# Patient Record
Sex: Female | Born: 1975 | Race: Black or African American | Hispanic: No | Marital: Single | Smoking: Never smoker
Health system: Southern US, Community
[De-identification: ages and names within clinical notes are randomized; demographics above are authoritative.]

## PROBLEM LIST (undated history)

## (undated) DIAGNOSIS — M3500 Sicca syndrome, unspecified: Secondary | ICD-10-CM

## (undated) DIAGNOSIS — G709 Myoneural disorder, unspecified: Secondary | ICD-10-CM

## (undated) DIAGNOSIS — M069 Rheumatoid arthritis, unspecified: Secondary | ICD-10-CM

## (undated) DIAGNOSIS — D219 Benign neoplasm of connective and other soft tissue, unspecified: Secondary | ICD-10-CM

## (undated) DIAGNOSIS — J45909 Unspecified asthma, uncomplicated: Secondary | ICD-10-CM

## (undated) DIAGNOSIS — D649 Anemia, unspecified: Secondary | ICD-10-CM

## (undated) HISTORY — PX: WISDOM TOOTH EXTRACTION: SHX21

## (undated) HISTORY — PX: TONSILLECTOMY: SUR1361

---

## 1998-04-30 ENCOUNTER — Other Ambulatory Visit: Admission: RE | Admit: 1998-04-30 | Discharge: 1998-04-30 | Payer: Self-pay | Admitting: Obstetrics

## 1999-11-13 ENCOUNTER — Encounter: Payer: Self-pay | Admitting: Obstetrics

## 1999-11-13 ENCOUNTER — Inpatient Hospital Stay (HOSPITAL_COMMUNITY): Admission: RE | Admit: 1999-11-13 | Discharge: 1999-11-13 | Payer: Self-pay | Admitting: Obstetrics

## 1999-11-13 ENCOUNTER — Encounter (INDEPENDENT_AMBULATORY_CARE_PROVIDER_SITE_OTHER): Payer: Self-pay

## 1999-11-13 ENCOUNTER — Inpatient Hospital Stay (HOSPITAL_COMMUNITY): Admission: AD | Admit: 1999-11-13 | Discharge: 1999-11-13 | Payer: Self-pay | Admitting: *Deleted

## 2000-01-07 ENCOUNTER — Emergency Department (HOSPITAL_COMMUNITY): Admission: EM | Admit: 2000-01-07 | Discharge: 2000-01-08 | Payer: Self-pay | Admitting: Emergency Medicine

## 2000-06-11 ENCOUNTER — Emergency Department (HOSPITAL_COMMUNITY): Admission: EM | Admit: 2000-06-11 | Discharge: 2000-06-11 | Payer: Self-pay | Admitting: Internal Medicine

## 2000-08-28 ENCOUNTER — Other Ambulatory Visit: Admission: RE | Admit: 2000-08-28 | Discharge: 2000-08-28 | Payer: Self-pay | Admitting: Obstetrics

## 2009-10-01 ENCOUNTER — Ambulatory Visit (HOSPITAL_COMMUNITY): Admission: RE | Admit: 2009-10-01 | Discharge: 2009-10-01 | Payer: Self-pay | Admitting: Obstetrics

## 2010-08-26 ENCOUNTER — Emergency Department (HOSPITAL_COMMUNITY)
Admission: EM | Admit: 2010-08-26 | Discharge: 2010-08-26 | Disposition: A | Discharge status: Home / Self Care | Payer: No Typology Code available for payment source | Attending: Emergency Medicine | Admitting: Emergency Medicine

## 2010-08-26 DIAGNOSIS — M542 Cervicalgia: Secondary | ICD-10-CM | POA: Insufficient documentation

## 2010-08-26 DIAGNOSIS — S0993XA Unspecified injury of face, initial encounter: Secondary | ICD-10-CM | POA: Insufficient documentation

## 2010-08-26 DIAGNOSIS — S199XXA Unspecified injury of neck, initial encounter: Secondary | ICD-10-CM | POA: Insufficient documentation

## 2010-08-26 DIAGNOSIS — Y92410 Unspecified street and highway as the place of occurrence of the external cause: Secondary | ICD-10-CM | POA: Insufficient documentation

## 2010-12-20 ENCOUNTER — Emergency Department (HOSPITAL_COMMUNITY)
Admission: EM | Admit: 2010-12-20 | Discharge: 2010-12-21 | Disposition: A | Discharge status: Home / Self Care | Payer: No Typology Code available for payment source | Attending: Emergency Medicine | Admitting: Emergency Medicine

## 2010-12-20 DIAGNOSIS — T148XXA Other injury of unspecified body region, initial encounter: Secondary | ICD-10-CM | POA: Insufficient documentation

## 2010-12-20 DIAGNOSIS — Y92410 Unspecified street and highway as the place of occurrence of the external cause: Secondary | ICD-10-CM | POA: Insufficient documentation

## 2010-12-20 DIAGNOSIS — M542 Cervicalgia: Secondary | ICD-10-CM | POA: Insufficient documentation

## 2010-12-21 ENCOUNTER — Emergency Department (HOSPITAL_COMMUNITY): Payer: No Typology Code available for payment source

## 2011-06-08 ENCOUNTER — Other Ambulatory Visit (HOSPITAL_COMMUNITY): Payer: Self-pay | Admitting: Obstetrics

## 2011-06-08 DIAGNOSIS — D219 Benign neoplasm of connective and other soft tissue, unspecified: Secondary | ICD-10-CM

## 2011-06-14 ENCOUNTER — Ambulatory Visit (HOSPITAL_COMMUNITY)
Admission: RE | Admit: 2011-06-14 | Discharge: 2011-06-14 | Disposition: A | Discharge status: Home / Self Care | Payer: BC Managed Care – PPO | Source: Ambulatory Visit | Attending: Obstetrics | Admitting: Obstetrics

## 2011-06-14 DIAGNOSIS — D259 Leiomyoma of uterus, unspecified: Secondary | ICD-10-CM | POA: Insufficient documentation

## 2011-06-14 DIAGNOSIS — D219 Benign neoplasm of connective and other soft tissue, unspecified: Secondary | ICD-10-CM

## 2012-01-15 ENCOUNTER — Encounter (HOSPITAL_COMMUNITY): Payer: Self-pay | Admitting: *Deleted

## 2012-01-15 ENCOUNTER — Emergency Department (HOSPITAL_COMMUNITY)
Admission: EM | Admit: 2012-01-15 | Discharge: 2012-01-15 | Disposition: A | Discharge status: Home / Self Care | Payer: BC Managed Care – PPO | Attending: Emergency Medicine | Admitting: Emergency Medicine

## 2012-01-15 DIAGNOSIS — J45909 Unspecified asthma, uncomplicated: Secondary | ICD-10-CM | POA: Insufficient documentation

## 2012-01-15 DIAGNOSIS — L03012 Cellulitis of left finger: Secondary | ICD-10-CM

## 2012-01-15 DIAGNOSIS — L03019 Cellulitis of unspecified finger: Secondary | ICD-10-CM | POA: Insufficient documentation

## 2012-01-15 HISTORY — DX: Unspecified asthma, uncomplicated: J45.909

## 2012-01-15 MED ORDER — CEPHALEXIN 250 MG PO CAPS: 250.0000 mg | ORAL_CAPSULE | Freq: Four times a day (QID) | ORAL | Status: AC

## 2012-01-15 NOTE — ED Provider Notes (Signed)
History     CSN: [id number]  Arrival date & time 01/15/12  8413   First MD Initiated Contact with Patient 01/15/12 [phone]      Chief Complaint  Patient presents with  . Finger Injury    (Consider location/radiation/quality/duration/timing/severity/associated sxs/prior treatment) HPI Hx from pt. Audrey Bell is a 36 y.o. female presenting with complaint of pain and swelling to her L 5th finger which started last night. Denies injury to the area. States swelling and redness initially started at distal nail and has gradually spread down the finger. Denies fever/chills, no drainage noted from the area. No treatment prior to coming here. She had her fingernails manicured on Thursday.  Past Medical History  Diagnosis Date  . Asthma     History reviewed. No pertinent past surgical history.  No family history on file.  History  Substance Use Topics  . Smoking status: Never Smoker   . Smokeless tobacco: Not on file  . Alcohol Use: No    OB History    Grav Para Term Preterm Abortions TAB SAB Ect Mult Living                  Review of Systems  Constitutional: Negative.   Musculoskeletal: Negative.   Skin: Positive for color change and wound.  Neurological: Negative for numbness.    Allergies  Review of patient's allergies indicates no known allergies.  Home Medications   Current Outpatient Rx  Name Route Sig Dispense Refill  . FERROUS SULFATE 325 (65 FE) MG PO TABS Oral Take 325 mg by mouth daily with breakfast.    . ADULT MULTIVITAMIN W/MINERALS CH Oral Take 1 tablet by mouth daily.    . CEPHALEXIN 250 MG PO CAPS Oral Take 1 capsule (250 mg total) by mouth 4 (four) times daily. 28 capsule 0    BP 129/71  Pulse 88  Temp 98.9 F (37.2 C) (Oral)  Resp 18  Ht 5\' 8"  (1.727 m)  Wt 154 lb (69.854 kg)  BMI 23.42 kg/m2  SpO2 100%  LMP 12/15/2011  Physical Exam  Nursing note and vitals reviewed. Constitutional: She appears well-developed and well-nourished. No  distress.  HENT:  Head: Normocephalic and atraumatic.  Neck: Normal range of motion.  Cardiovascular: Normal rate.   Pulmonary/Chest: Effort normal.  Musculoskeletal: Normal range of motion.  Neurological: She is alert.  Skin: Skin is warm and dry. She is not diaphoretic.       Erythema and edema to base of nail at L 5th finger. Subtle erythema extending down finger to the PIP. TTP over area of edema; no drainage noted. FROM in finger. NVI  Psychiatric: She has a normal mood and affect.    ED Course  INCISION AND DRAINAGE Performed by: Grant Fontana Authorized by: Grant Fontana Consent: Verbal consent obtained. Risks and benefits: risks, benefits and alternatives were discussed Type: abscess Body area: upper extremity Location details: left small finger Anesthesia: digital block Local anesthetic: lidocaine 1% without epinephrine Anesthetic total: 4 ml Needle gauge: 18 Complexity: simple Drainage: purulent Drainage amount: moderate Patient tolerance: Patient tolerated the procedure well with no immediate complications.   (including critical care time)  Labs Reviewed - No data to display No results found.   1. Paronychia of fifth finger of left hand       MDM  Pt presents with swelling to L 5th finger. Appears c/w paronychia. Digital block was performed and the area was drained which was productive of purulent drainage. Will start  on Keflex, advised to cont warm soaks at home. Reasons to return discussed. Pt verbalized understanding, agreeable with plan.        Grant Fontana, PA-C 01/15/12 [phone]

## 2012-01-15 NOTE — ED Provider Notes (Addendum)
Medical screening examination/treatment/procedure(s) were performed by non-physician practitioner and as supervising physician I was immediately available for consultation/collaboration.    Dione Booze, MD 01/15/12 [phone]

## 2012-01-15 NOTE — ED Notes (Signed)
Pt noted swelling to fifth digit on left hand starting last night after seasoning chicken. Pt denies known injury or history of similar. Pt has swelling and redness noted to entire fifth digit. Pt reports swelling started initially at nailbed.

## 2012-02-02 ENCOUNTER — Other Ambulatory Visit: Payer: Self-pay | Admitting: Obstetrics

## 2012-02-02 DIAGNOSIS — N6310 Unspecified lump in the right breast, unspecified quadrant: Secondary | ICD-10-CM

## 2012-02-06 ENCOUNTER — Ambulatory Visit
Admission: RE | Admit: 2012-02-06 | Discharge: 2012-02-06 | Disposition: A | Discharge status: Home / Self Care | Payer: BC Managed Care – PPO | Source: Ambulatory Visit | Attending: Obstetrics | Admitting: Obstetrics

## 2012-02-06 DIAGNOSIS — N6310 Unspecified lump in the right breast, unspecified quadrant: Secondary | ICD-10-CM

## 2012-08-22 IMAGING — US US PELVIS COMPLETE
1 series · 14 of 25 positions shown · non-contrast
Comparison: None.

CLINICAL DATA: Fibroids

TRANSABDOMINAL AND TRANSVAGINAL ULTRASOUND OF PELVIS
TECHNIQUE: Both transabdominal and transvaginal ultrasound
examinations of the pelvis were performed. Transabdominal technique
was performed for global imaging of the pelvis including uterus,
ovaries, adnexal regions, and pelvic cul-de-sac.

[Series 1: us pelvis complete · 14 of 73 slices shown]
[im 1/73]
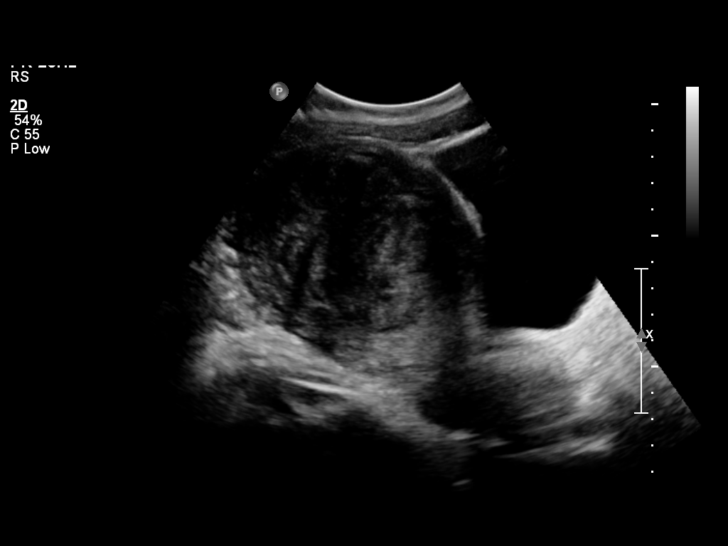
[im 7/73]
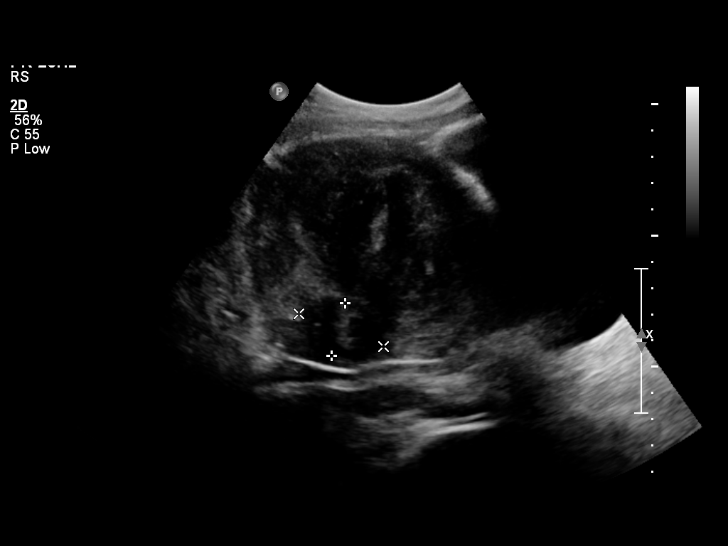
[im 13/73]
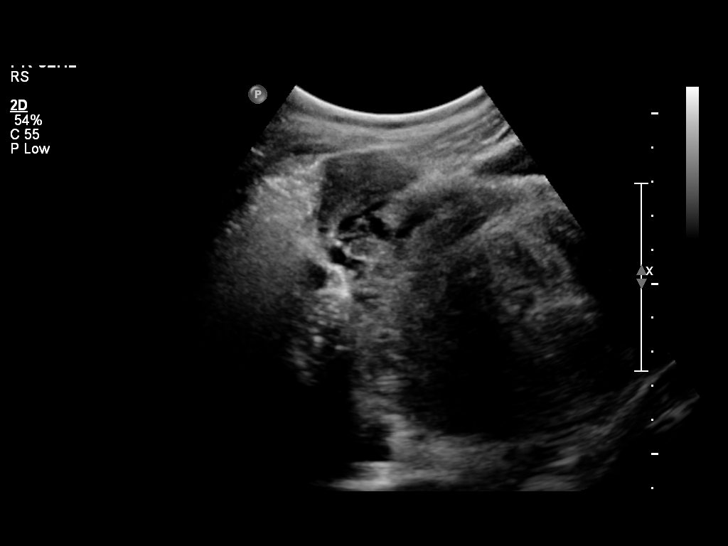
[im 19/73]
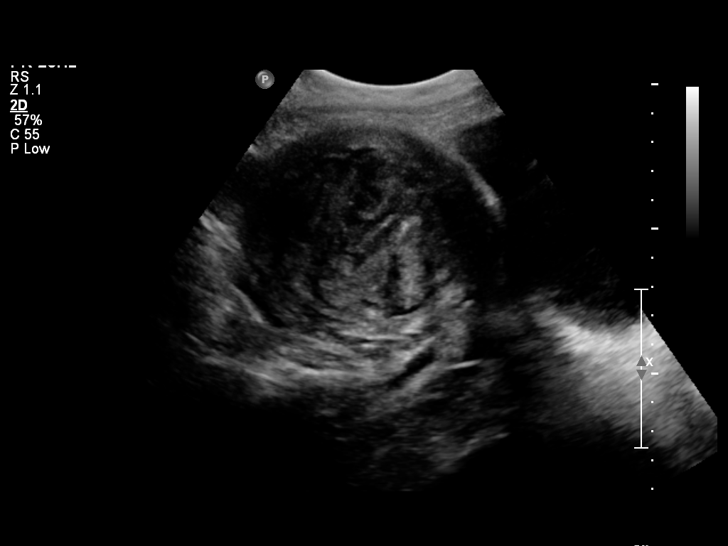
[im 25/73]
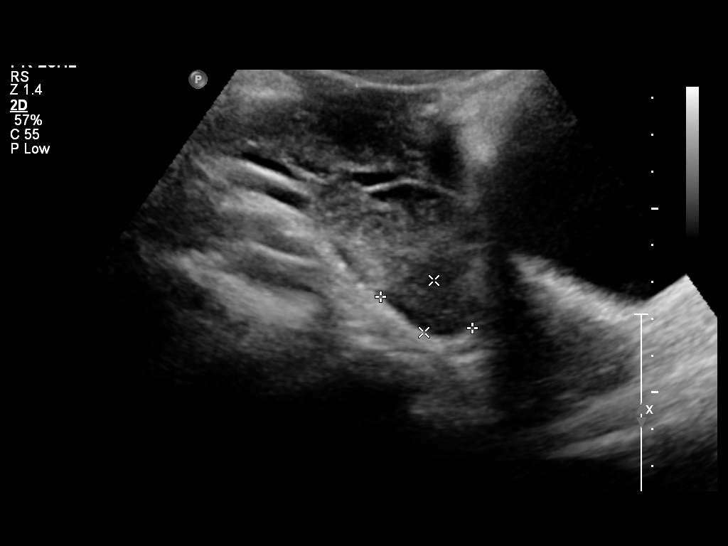
[im 28/73]
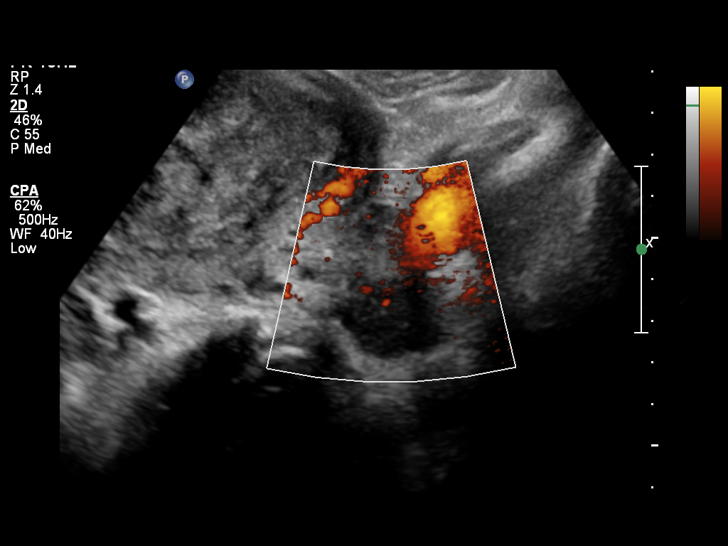
[im 34/73]
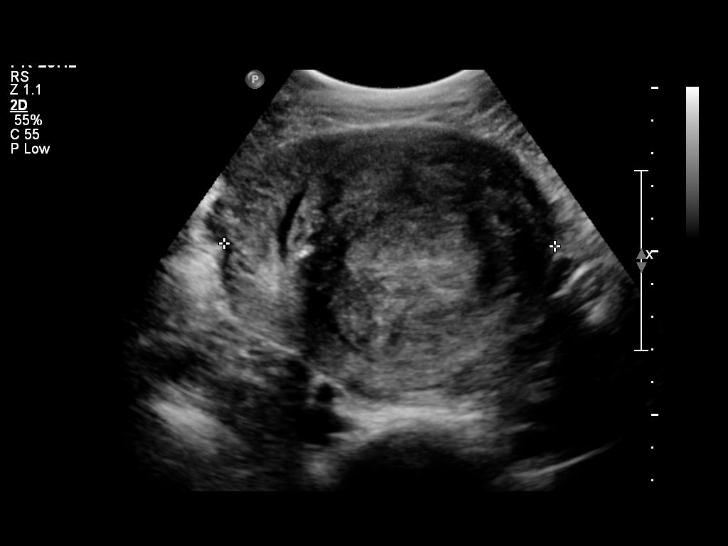
[im 40/73]
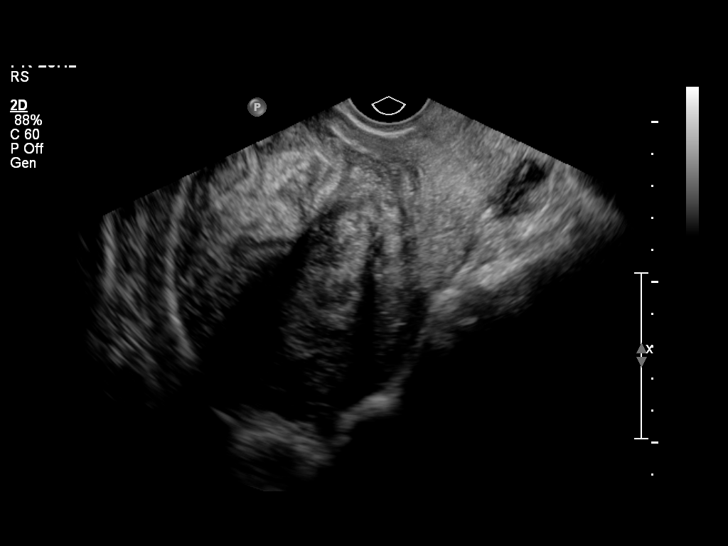
[im 46/73]
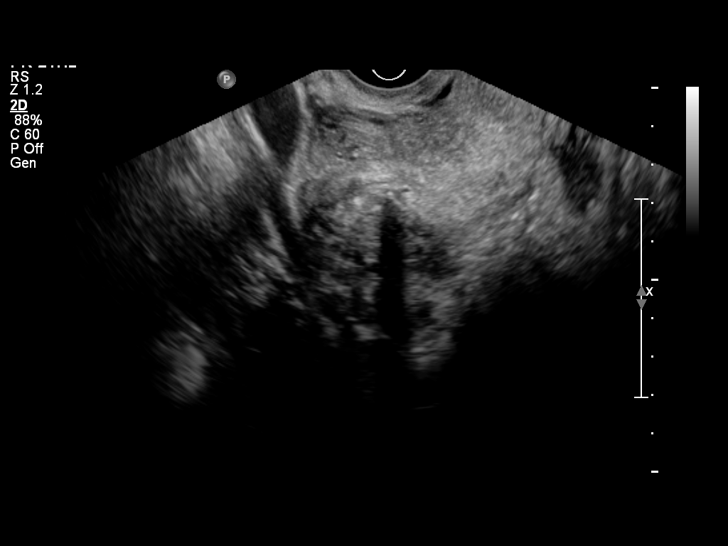
[im 49/73]
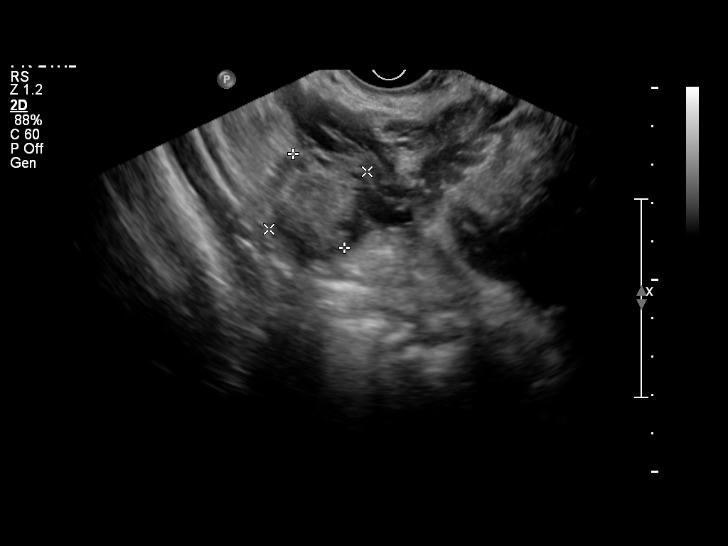
[im 55/73]
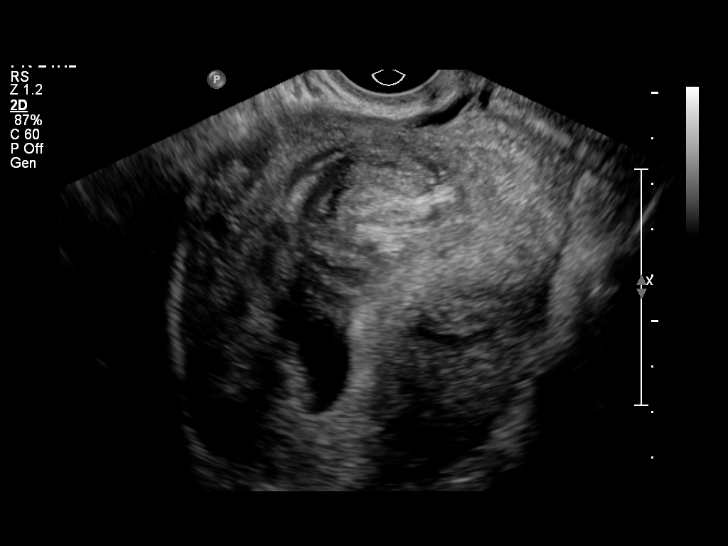
[im 61/73]
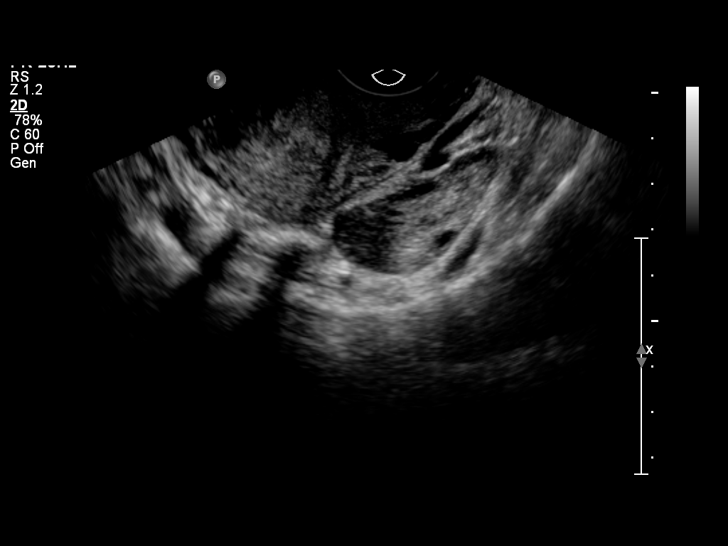
[im 67/73]
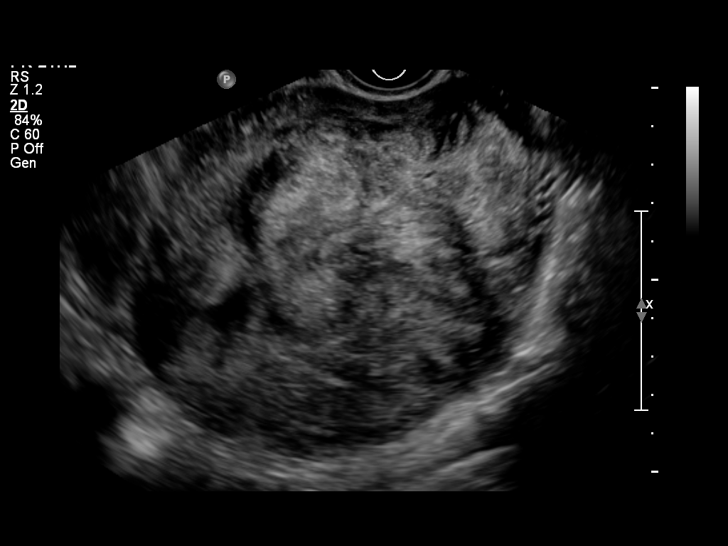
[im 73/73]
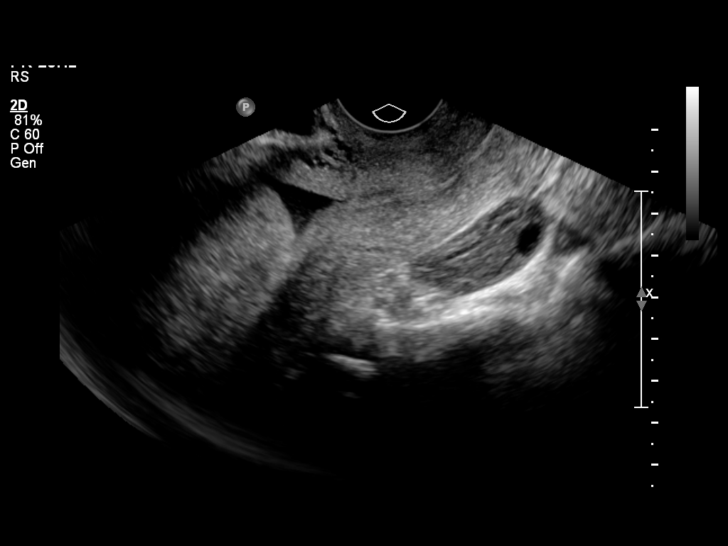

[14 of 25 positions shown; findings below may reference images not displayed]

It was necessary to proceed with endovaginal exam following the
transabdominal exam to visualize the endometrium and adnexa.
FINDINGS: The uterus is enlarged and lobular in contour, measuring 14.2 x
x 10.1 cm.  There is a 7.8 cm fundal myometrial fibroid to the left
of midline and a 3.4 cm right mid segment myometrial fibroid.  The
endometrium is displaced to the right of midline from the dominant
fibroid but is not thickened, measuring approximately 7 mm.  A
small amount of free fluid is present within the endometrial
cavity.

Both ovaries have normal size and appearance.  The right ovary
measures 2.8 x 3.0 x 2.0 cm, and the left ovary measures 4.1 x
x 3.5 cm.  There are no adnexal masses or free pelvic fluid.
IMPRESSION: Fibroid uterus, as described above..

## 2013-04-08 ENCOUNTER — Other Ambulatory Visit: Payer: Self-pay | Admitting: Obstetrics

## 2013-04-08 ENCOUNTER — Other Ambulatory Visit (HOSPITAL_COMMUNITY): Payer: Self-pay | Admitting: Obstetrics

## 2013-04-08 DIAGNOSIS — N63 Unspecified lump in unspecified breast: Secondary | ICD-10-CM

## 2013-04-08 DIAGNOSIS — D259 Leiomyoma of uterus, unspecified: Secondary | ICD-10-CM

## 2013-04-16 ENCOUNTER — Other Ambulatory Visit: Payer: Self-pay | Admitting: Obstetrics

## 2013-04-16 ENCOUNTER — Ambulatory Visit
Admission: RE | Admit: 2013-04-16 | Discharge: 2013-04-16 | Disposition: A | Discharge status: Home / Self Care | Payer: BC Managed Care – PPO | Source: Ambulatory Visit | Attending: Obstetrics | Admitting: Obstetrics

## 2013-04-16 ENCOUNTER — Ambulatory Visit (HOSPITAL_COMMUNITY)
Admission: RE | Admit: 2013-04-16 | Discharge: 2013-04-16 | Disposition: A | Discharge status: Home / Self Care | Payer: BC Managed Care – PPO | Source: Ambulatory Visit | Attending: Obstetrics | Admitting: Obstetrics

## 2013-04-16 DIAGNOSIS — D259 Leiomyoma of uterus, unspecified: Secondary | ICD-10-CM | POA: Insufficient documentation

## 2013-04-16 DIAGNOSIS — N63 Unspecified lump in unspecified breast: Secondary | ICD-10-CM

## 2013-04-16 DIAGNOSIS — N852 Hypertrophy of uterus: Secondary | ICD-10-CM | POA: Insufficient documentation

## 2013-04-19 ENCOUNTER — Encounter (HOSPITAL_COMMUNITY): Payer: Self-pay | Admitting: Emergency Medicine

## 2013-04-19 ENCOUNTER — Emergency Department (INDEPENDENT_AMBULATORY_CARE_PROVIDER_SITE_OTHER)
Admission: EM | Admit: 2013-04-19 | Discharge: 2013-04-19 | Disposition: A | Discharge status: Home / Self Care | Payer: BC Managed Care – PPO | Source: Home / Self Care | Attending: Family Medicine | Admitting: Family Medicine

## 2013-04-19 DIAGNOSIS — J069 Acute upper respiratory infection, unspecified: Secondary | ICD-10-CM

## 2013-04-19 MED ORDER — PREDNISONE 50 MG PO TABS: ORAL_TABLET | ORAL | Status: DC

## 2013-04-19 NOTE — ED Provider Notes (Signed)
CSN: [id number]     Arrival date & time 04/19/13  1819 History   First MD Initiated Contact with Patient 04/19/13 1838     Chief Complaint  Patient presents with  . URI   (Consider location/radiation/quality/duration/timing/severity/associated sxs/prior Treatment) HPI Comments: 37 year old female presents complaining of being sick for 5 days. For 5 days, she has had a cough, chest congestion, and sore throat. She also admits to some mild possible shortness of breath when lying down at night. She has a history of asthma and she has been using her inhaler much more than usual in the past few nights. Otherwise, she is not feeling short of breath and has no pleuritic chest pain. No fever, chills, NVD, recent travel, sick contacts. Cough is nonproductive. She has been taking Mucinex and combination cough and cold medications with some relief of her symptoms.  Patient is a 37 y.o. female presenting with URI.  URI Presenting symptoms: cough and sore throat   Presenting symptoms: no fever   Associated symptoms: no arthralgias and no myalgias     Past Medical History  Diagnosis Date  . Asthma    History reviewed. No pertinent past surgical history. History reviewed. No pertinent family history. History  Substance Use Topics  . Smoking status: Never Smoker   . Smokeless tobacco: Not on file  . Alcohol Use: No   OB History   Grav Para Term Preterm Abortions TAB SAB Ect Mult Living                 Review of Systems  Constitutional: Negative for fever and chills.  HENT: Positive for sore throat and voice change.   Eyes: Negative for visual disturbance.  Respiratory: Positive for cough, chest tightness and shortness of breath.   Cardiovascular: Negative for chest pain, palpitations and leg swelling.  Gastrointestinal: Negative for nausea, vomiting and abdominal pain.  Endocrine: Negative for polydipsia and polyuria.  Genitourinary: Negative for dysuria, urgency and frequency.   Musculoskeletal: Negative for arthralgias and myalgias.  Skin: Negative for rash.  Neurological: Negative for dizziness, weakness and light-headedness.    Allergies  Review of patient's allergies indicates no known allergies.  Home Medications   Current Outpatient Rx  Name  Route  Sig  Dispense  Refill  . Norgestim-Eth Estrad Triphasic (ORTHO TRI-CYCLEN, 28, PO)   Oral   Take by mouth.         . ferrous sulfate 325 (65 FE) MG tablet   Oral   Take 325 mg by mouth daily with breakfast.         . Multiple Vitamin (MULTIVITAMIN WITH MINERALS) TABS   Oral   Take 1 tablet by mouth daily.         . predniSONE (DELTASONE) 50 MG tablet      1 tab PO QD for 5 days   5 tablet   0    BP 143/95  Pulse 87  Temp(Src) 98.5 F (36.9 C) (Oral)  Resp 18  SpO2 100%  LMP 04/03/2013 Physical Exam  Nursing note and vitals reviewed. Constitutional: She is oriented to person, place, and time. Vital signs are normal. She appears well-developed and well-nourished. No distress.  HENT:  Head: Normocephalic and atraumatic.  Nose: Nose normal.  Mouth/Throat: Oropharynx is clear and moist. No oropharyngeal exudate.  Neck: Normal range of motion. Neck supple.  Cardiovascular: Normal rate, regular rhythm and normal heart sounds.  Exam reveals no gallop and no friction rub.   No murmur heard. Pulmonary/Chest:  Effort normal and breath sounds normal. No respiratory distress. She has no wheezes. She has no rales.  Lymphadenopathy:    She has no cervical adenopathy.  Neurological: She is alert and oriented to person, place, and time. She has normal strength. Coordination normal.  Skin: Skin is warm and dry. No rash noted. She is not diaphoretic.  Psychiatric: She has a normal mood and affect. Judgment normal.    ED Course  Procedures (including critical care time) Labs Review Labs Reviewed - No data to display Imaging Review No results found.    MDM   1. URI (upper respiratory  infection)    Add prednisone to prevent asthma flare. Continue albuterol and symptomatic treatment, push fluids. Followup if worsening.   New Prescriptions   PREDNISONE (DELTASONE) 50 MG TABLET    1 tab PO QD for 5 days       Graylon Good, PA-C 04/19/13 1908

## 2013-04-19 NOTE — ED Notes (Signed)
C/o  Loss of voice.  Dry nonproductive cough.  Chest congestion.  Mild relief with otc meds.  Denies fever, n/v/d

## 2013-04-20 NOTE — ED Provider Notes (Signed)
Medical screening examination/treatment/procedure(s) were performed by a resident physician or non-physician practitioner and as the supervising physician I was immediately available for consultation/collaboration.  Evan Corey, MD    Evan S Corey, MD 04/20/13 0843 

## 2013-05-02 DIAGNOSIS — M3500 Sicca syndrome, unspecified: Secondary | ICD-10-CM

## 2013-05-02 HISTORY — DX: Sjogren syndrome, unspecified: M35.00

## 2014-05-26 ENCOUNTER — Other Ambulatory Visit (HOSPITAL_COMMUNITY): Payer: Self-pay | Admitting: Obstetrics

## 2014-05-26 DIAGNOSIS — D251 Intramural leiomyoma of uterus: Secondary | ICD-10-CM

## 2014-06-02 ENCOUNTER — Ambulatory Visit (HOSPITAL_COMMUNITY)
Admission: RE | Admit: 2014-06-02 | Discharge: 2014-06-02 | Disposition: A | Discharge status: Home / Self Care | Payer: BC Managed Care – PPO | Source: Ambulatory Visit | Attending: Obstetrics | Admitting: Obstetrics

## 2014-06-02 DIAGNOSIS — D251 Intramural leiomyoma of uterus: Secondary | ICD-10-CM

## 2014-06-02 DIAGNOSIS — N939 Abnormal uterine and vaginal bleeding, unspecified: Secondary | ICD-10-CM | POA: Diagnosis not present

## 2014-06-02 DIAGNOSIS — D250 Submucous leiomyoma of uterus: Secondary | ICD-10-CM | POA: Insufficient documentation

## 2014-09-18 ENCOUNTER — Other Ambulatory Visit: Payer: Self-pay | Admitting: Obstetrics

## 2014-09-18 ENCOUNTER — Other Ambulatory Visit: Payer: Self-pay

## 2014-09-18 DIAGNOSIS — Z1231 Encounter for screening mammogram for malignant neoplasm of breast: Secondary | ICD-10-CM

## 2014-10-01 ENCOUNTER — Ambulatory Visit
Admission: RE | Admit: 2014-10-01 | Discharge: 2014-10-01 | Disposition: A | Discharge status: Home / Self Care | Payer: BC Managed Care – PPO | Source: Ambulatory Visit

## 2014-10-01 DIAGNOSIS — Z1231 Encounter for screening mammogram for malignant neoplasm of breast: Secondary | ICD-10-CM

## 2015-08-04 ENCOUNTER — Other Ambulatory Visit: Payer: Self-pay

## 2015-08-04 DIAGNOSIS — Z1231 Encounter for screening mammogram for malignant neoplasm of breast: Secondary | ICD-10-CM

## 2015-10-02 ENCOUNTER — Ambulatory Visit
Admission: RE | Admit: 2015-10-02 | Discharge: 2015-10-02 | Disposition: A | Discharge status: Home / Self Care | Payer: BC Managed Care – PPO | Source: Ambulatory Visit

## 2015-10-02 ENCOUNTER — Other Ambulatory Visit: Payer: Self-pay | Admitting: Obstetrics

## 2015-10-02 DIAGNOSIS — Z1231 Encounter for screening mammogram for malignant neoplasm of breast: Secondary | ICD-10-CM

## 2016-01-19 LAB — PROCEDURE REPORT - SCANNED: Pap: ABNORMAL — AB

## 2016-06-20 ENCOUNTER — Other Ambulatory Visit: Payer: Self-pay | Admitting: Obstetrics and Gynecology

## 2016-06-20 DIAGNOSIS — D251 Intramural leiomyoma of uterus: Secondary | ICD-10-CM

## 2016-06-23 ENCOUNTER — Ambulatory Visit
Admission: RE | Admit: 2016-06-23 | Discharge: 2016-06-23 | Disposition: A | Discharge status: Home / Self Care | Payer: BC Managed Care – PPO | Source: Ambulatory Visit | Attending: Obstetrics and Gynecology | Admitting: Obstetrics and Gynecology

## 2016-06-23 ENCOUNTER — Other Ambulatory Visit: Payer: Self-pay | Admitting: Obstetrics and Gynecology

## 2016-06-23 DIAGNOSIS — D251 Intramural leiomyoma of uterus: Secondary | ICD-10-CM

## 2016-06-23 HISTORY — DX: Benign neoplasm of connective and other soft tissue, unspecified: D21.9

## 2016-06-23 HISTORY — DX: Rheumatoid arthritis, unspecified: M06.9

## 2016-06-23 HISTORY — PX: IR GENERIC HISTORICAL: IMG1180011

## 2016-06-23 NOTE — Consult Note (Signed)
Chief Complaint: Patient was seen in consultation today for  Chief Complaint  Patient presents with  . Advice Only    Consult for [place]     at the request of Cousins,Sheronette  Referring Physician(s): Cousins,Sheronette  History of Present Illness: Audrey Bell is a 41 y.o. female gravida 2 para 2, who has a 5 year history of fibroids and menorrhagia. This has been worsening over the recent years. She is anemic and taking over-the-counter iron therapy. During her heavy bleeding days, she changes pads every 2 hours and does pass clots. Her periods last 7-10 days. She does have intermenstrual bleeding which is problematic. She has no future pregnancy plans. She denies PID or previous pelvic malignancy. She has not had any previous therapy other than oral contraceptives. She does have some bulk symptoms related to frequency, abdominal pressure, and bloating.  Past Medical History:  Diagnosis Date  . Asthma   . Fibroids   . Rheumatoid arthritis [facility])     Past Surgical History:  Procedure Laterality Date  . IR GENERIC HISTORICAL  06/23/2016   IR RADIOLOGIST EVAL & MGMT 06/23/2016 Marybelle Killings, MD [facility] INTERV RAD    Allergies: Shellfish allergy  Medications: Prior to Admission medications   Medication Sig Start Date End Date Taking? Authorizing Provider  ferrous sulfate 325 (65 FE) MG tablet Take 325 mg by mouth daily with breakfast.   Yes Historical Provider, MD  Multiple Vitamin (MULTIVITAMIN WITH MINERALS) TABS Take 1 tablet by mouth daily.   Yes Historical Provider, MD  naproxen sodium (ANAPROX) 220 MG tablet Take 220 mg by mouth 2 (two) times daily as needed.   Yes Historical Provider, MD  Norgestim-Eth Estrad Triphasic (ORTHO TRI-CYCLEN, 28, PO) Take by mouth.   Yes Historical Provider, MD  predniSONE (DELTASONE) 50 MG tablet 1 tab PO QD for 5 days 04/19/13  Yes Liam Graham, PA-C  sulfaSALAzine (AZULFIDINE) 500 MG tablet Take 500 mg by mouth daily.   Yes Historical  Provider, MD  Tofacitinib Citrate (XELJANZ) 5 MG TABS Take 1 tablet by mouth 2 (two) times daily.   Yes Historical Provider, MD     No family history on file.  Social History   Social History  . Marital status: Single    Spouse name: N/A  . Number of children: N/A  . Years of education: N/A   Social History Main Topics  . Smoking status: Never Smoker  . Smokeless tobacco: Not on file  . Alcohol use No  . Drug use: No  . Sexual activity: Yes    Birth control/ protection: Pill   Other Topics Concern  . Not on file   Social History Narrative  . No narrative on file    Review of Systems: A 12 point ROS discussed and pertinent positives are indicated in the HPI above.  All other systems are negative.  Review of Systems  Vital Signs: BP 128/90 (BP Location: Left Arm, Patient Position: Sitting, Cuff Size: Normal)   Pulse 82   Temp 98.7 F (37.1 C) (Oral)   Resp 14   Ht 5' 7.5" (1.715 m)   Wt 163 lb (73.9 kg)   LMP 05/23/2016 (Approximate)   SpO2 100%   BMI 25.15 kg/m   Physical Exam  Constitutional: She is oriented to person, place, and time. She appears well-developed and well-nourished.  Cardiovascular: Normal rate and regular rhythm.   Pulmonary/Chest: Effort normal and breath sounds normal.  Musculoskeletal: Normal range of motion.  Neurological:  She is alert and oriented to person, place, and time.    Mallampati Score: 1    Imaging: Ir Radiologist Eval & Mgmt  Result Date: 06/23/2016 Please refer to "Notes" to see consult details. Ultrasound was performed. Fibroids were noted. The largest described fibroid was 7.5 cm.  Labs:  CBC: No results for input(s): WBC, HGB, HCT, PLT in the last 8760 hours.  COAGS: No results for input(s): INR, APTT in the last 8760 hours.  BMP: No results for input(s): NA, K, CL, CO2, GLUCOSE, BUN, CALCIUM, CREATININE, GFRNONAA, GFRAA in the last 8760 hours.  Invalid input(s): CMP  LIVER FUNCTION TESTS: No results  for input(s): BILITOT, AST, ALT, ALKPHOS, PROT, ALBUMIN in the last 8760 hours.  TUMOR MARKERS: No results for input(s): AFPTM, CEA, CA199, CHROMGRNA in the last 8760 hours.  Assessment and Plan:  This Bowell does have fibroids by ultrasound. She is symptomatic consisting of menorrhagia with anemia and some intermenstrual bleeding. We discussed the procedure of uterine fibroid embolization as well as alternatives. Her questions were answered. She will require a pelvic MRI and endometrial biopsy prior to therapy.  Thank you for this interesting consult.  I greatly enjoyed meeting Audrey Bell and look forward to participating in their care.  A copy of this report was sent to the requesting provider on this date.  Electronically Signed: Boruch Manuele, ART A 06/23/2016, 10:19 AM   I spent a total of  40 Minutes   in face to face in clinical consultation, greater than 50% of which was counseling/coordinating care for uterine fibroid embolization.

## 2016-06-24 ENCOUNTER — Encounter (HOSPITAL_COMMUNITY): Payer: Self-pay | Admitting: Emergency Medicine

## 2016-06-24 ENCOUNTER — Ambulatory Visit (HOSPITAL_COMMUNITY)
Admission: EM | Admit: 2016-06-24 | Discharge: 2016-06-24 | Disposition: A | Discharge status: Home / Self Care | Payer: BC Managed Care – PPO | Attending: Family Medicine | Admitting: Family Medicine

## 2016-06-24 DIAGNOSIS — B9789 Other viral agents as the cause of diseases classified elsewhere: Secondary | ICD-10-CM | POA: Diagnosis not present

## 2016-06-24 DIAGNOSIS — J069 Acute upper respiratory infection, unspecified: Secondary | ICD-10-CM

## 2016-06-24 MED ORDER — GUAIFENESIN-CODEINE 100-10 MG/5ML PO SYRP: 10.0000 mL | ORAL_SOLUTION | Freq: Four times a day (QID) | ORAL | 0 refills | Status: DC | PRN

## 2016-06-24 MED ORDER — IPRATROPIUM BROMIDE 0.06 % NA SOLN: 2.0000 | Freq: Four times a day (QID) | NASAL | 1 refills | Status: DC

## 2016-06-24 NOTE — ED Provider Notes (Signed)
Grandfather    CSN: [id number] Arrival date & time: 06/24/16  1532     History   Chief Complaint Chief Complaint  Patient presents with  . URI    HPI Audrey Bell is a 41 y.o. female.   The history is provided by the patient.  URI  Presenting symptoms: congestion, cough and rhinorrhea   Presenting symptoms: no fever   Severity:  Mild Onset quality:  Gradual Duration:  5 days Progression:  Unchanged Chronicity:  New Risk factors: sick contacts     Past Medical History:  Diagnosis Date  . Asthma   . Fibroids   . Rheumatoid arthritis ([place])     There are no active problems to display for this patient.   Past Surgical History:  Procedure Laterality Date  . IR GENERIC HISTORICAL  06/23/2016   IR RADIOLOGIST EVAL & MGMT 06/23/2016 Marybelle Killings, MD [facility] INTERV RAD    OB History    No data available       Home Medications    Prior to Admission medications   Medication Sig Start Date End Date Taking? Authorizing Provider  Norgestim-Eth Estrad Triphasic (ORTHO TRI-CYCLEN, 28, PO) Take by mouth.   Yes Historical Provider, MD  predniSONE (DELTASONE) 50 MG tablet 1 tab PO QD for 5 days 04/19/13  Yes Liam Graham, PA-C  sulfaSALAzine (AZULFIDINE) 500 MG tablet Take 500 mg by mouth daily.   Yes Historical Provider, MD  Tofacitinib Citrate (XELJANZ) 5 MG TABS Take 1 tablet by mouth 2 (two) times daily.   Yes Historical Provider, MD  ferrous sulfate 325 (65 FE) MG tablet Take 325 mg by mouth daily with breakfast.    Historical Provider, MD  Multiple Vitamin (MULTIVITAMIN WITH MINERALS) TABS Take 1 tablet by mouth daily.    Historical Provider, MD  naproxen sodium (ANAPROX) 220 MG tablet Take 220 mg by mouth 2 (two) times daily as needed.    Historical Provider, MD    Family History No family history on file.  Social History Social History  Substance Use Topics  . Smoking status: Never Smoker  . Smokeless tobacco: Never Used  . Alcohol use No      Allergies   Shellfish allergy   Review of Systems Review of Systems  Constitutional: Negative.  Negative for fever.  HENT: Positive for congestion and rhinorrhea.   Respiratory: Positive for cough.   All other systems reviewed and are negative.    Physical Exam Triage Vital Signs ED Triage Vitals [06/24/16 1601]  Enc Vitals Group     BP 129/87     Pulse Rate 81     Resp 20     Temp 98.7 F (37.1 C)     Temp Source Oral     SpO2 100 %     Weight      Height      Head Circumference      Peak Flow      Pain Score      Pain Loc      Pain Edu?      Excl. in [place]?    No data found.   Updated Vital Signs BP 129/87 (BP Location: Right Arm)   Pulse 81   Temp 98.7 F (37.1 C) (Oral)   Resp 20   LMP 05/26/2016   SpO2 100%   Visual Acuity Right Eye Distance:   Left Eye Distance:   Bilateral Distance:    Right Eye Near:   Left  Eye Near:    Bilateral Near:     Physical Exam  Constitutional: She appears well-developed and well-nourished. No distress.  HENT:  Right Ear: External ear normal.  Left Ear: External ear normal.  Nose: Nose normal.  Mouth/Throat: Oropharynx is clear and moist.  Eyes: Conjunctivae are normal. Pupils are equal, round, and reactive to light.  Neck: Normal range of motion. Neck supple.  Cardiovascular: Normal rate and regular rhythm.   Pulmonary/Chest: Effort normal and breath sounds normal.  Lymphadenopathy:    She has no cervical adenopathy.  Skin: Skin is warm and dry.  Nursing note and vitals reviewed.    UC Treatments / Results  Labs (all labs ordered are listed, but only abnormal results are displayed) Labs Reviewed - No data to display  EKG  EKG Interpretation None       Radiology Ir Radiologist Eval & Mgmt  Result Date: 06/23/2016 Please refer to "Notes" to see consult details.   Procedures Procedures (including critical care time)  Medications Ordered in UC Medications - No data to display   Initial  Impression / Assessment and Plan / UC Course  I have reviewed the triage vital signs and the nursing notes.  Pertinent labs & imaging results that were available during my care of the patient were reviewed by me and considered in my medical decision making (see chart for details).       Final Clinical Impressions(s) / UC Diagnoses   Final diagnoses:  None    New Prescriptions New Prescriptions   No medications on file     Billy Fischer, MD 06/24/16 [phone]

## 2016-06-24 NOTE — ED Triage Notes (Signed)
Pt c/o cold sx onset: 5 days  Sx include: chest/nasal congestion, prod cough, bilateral eye drainage, laryngitis, ST  Denies: fevers  Taking: OTC cold meds w/temp relief.... Last had dayquil today at 1300  A&O x4... NAD

## 2016-06-24 NOTE — Discharge Instructions (Signed)
Drink plenty of fluids as discussed, use medicine as prescribed, and mucinex or delsym for cough. Return or see your doctor if further problems °

## 2016-07-02 ENCOUNTER — Ambulatory Visit
Admission: RE | Admit: 2016-07-02 | Discharge: 2016-07-02 | Disposition: A | Discharge status: Home / Self Care | Payer: BC Managed Care – PPO | Source: Ambulatory Visit | Attending: Obstetrics and Gynecology | Admitting: Obstetrics and Gynecology

## 2016-07-02 DIAGNOSIS — D251 Intramural leiomyoma of uterus: Secondary | ICD-10-CM

## 2016-07-02 MED ORDER — GADOBENATE DIMEGLUMINE 529 MG/ML IV SOLN
15.0000 mL | Freq: Once | INTRAVENOUS | Status: AC | PRN
  Administered 2016-07-02: 15 mL via INTRAVENOUS

## 2016-08-09 ENCOUNTER — Other Ambulatory Visit (HOSPITAL_COMMUNITY): Payer: Self-pay | Admitting: Interventional Radiology

## 2016-08-09 DIAGNOSIS — D259 Leiomyoma of uterus, unspecified: Secondary | ICD-10-CM

## 2016-08-10 ENCOUNTER — Other Ambulatory Visit: Payer: Self-pay | Admitting: Obstetrics and Gynecology

## 2016-08-11 ENCOUNTER — Encounter (HOSPITAL_COMMUNITY): Payer: Self-pay | Admitting: *Deleted

## 2016-08-15 ENCOUNTER — Ambulatory Visit (HOSPITAL_COMMUNITY): Payer: BC Managed Care – PPO | Admitting: Registered Nurse

## 2016-08-15 ENCOUNTER — Encounter (HOSPITAL_COMMUNITY): Payer: Self-pay

## 2016-08-15 ENCOUNTER — Encounter (HOSPITAL_COMMUNITY)
Admission: RE | Disposition: A | Discharge status: Home / Self Care | Payer: Self-pay | Source: Ambulatory Visit | Attending: Obstetrics and Gynecology

## 2016-08-15 ENCOUNTER — Ambulatory Visit (HOSPITAL_COMMUNITY)
Admission: RE | Admit: 2016-08-15 | Discharge: 2016-08-15 | Disposition: A | Discharge status: Home / Self Care | Payer: BC Managed Care – PPO | Source: Ambulatory Visit | Attending: Obstetrics and Gynecology | Admitting: Obstetrics and Gynecology

## 2016-08-15 DIAGNOSIS — Z793 Long term (current) use of hormonal contraceptives: Secondary | ICD-10-CM | POA: Diagnosis not present

## 2016-08-15 DIAGNOSIS — Z7952 Long term (current) use of systemic steroids: Secondary | ICD-10-CM | POA: Insufficient documentation

## 2016-08-15 DIAGNOSIS — N871 Moderate cervical dysplasia: Secondary | ICD-10-CM | POA: Diagnosis not present

## 2016-08-15 DIAGNOSIS — Z79899 Other long term (current) drug therapy: Secondary | ICD-10-CM | POA: Insufficient documentation

## 2016-08-15 DIAGNOSIS — M069 Rheumatoid arthritis, unspecified: Secondary | ICD-10-CM | POA: Insufficient documentation

## 2016-08-15 DIAGNOSIS — D250 Submucous leiomyoma of uterus: Secondary | ICD-10-CM | POA: Insufficient documentation

## 2016-08-15 DIAGNOSIS — D259 Leiomyoma of uterus, unspecified: Secondary | ICD-10-CM | POA: Diagnosis present

## 2016-08-15 DIAGNOSIS — N920 Excessive and frequent menstruation with regular cycle: Secondary | ICD-10-CM | POA: Diagnosis not present

## 2016-08-15 HISTORY — DX: Sicca syndrome, unspecified: M35.00

## 2016-08-15 HISTORY — PX: DILATATION & CURETTAGE/HYSTEROSCOPY WITH MYOSURE: SHX6511

## 2016-08-15 HISTORY — DX: Anemia, unspecified: D64.9

## 2016-08-15 HISTORY — DX: Myoneural disorder, unspecified: G70.9

## 2016-08-15 LAB — CBC
HEMATOCRIT: 36.8 % (ref 36.0–46.0)
Hemoglobin: 12.2 g/dL (ref 12.0–15.0)
MCH: 30.8 pg (ref 26.0–34.0)
MCHC: 33.2 g/dL (ref 30.0–36.0)
MCV: 92.9 fL (ref 78.0–100.0)
PLATELETS: 355 10*3/uL (ref 150–400)
RBC: 3.96 MIL/uL (ref 3.87–5.11)
RDW: 14.5 % (ref 11.5–15.5)
WBC: 5.9 10*3/uL (ref 4.0–10.5)

## 2016-08-15 SURGERY — DILATATION & CURETTAGE/HYSTEROSCOPY WITH MYOSURE
Anesthesia: General | Site: Vagina

## 2016-08-15 MED ORDER — FENTANYL CITRATE (PF) 100 MCG/2ML IJ SOLN
INTRAMUSCULAR | Status: DC | PRN
  Administered 2016-08-15: 100 ug via INTRAVENOUS

## 2016-08-15 MED ORDER — ONDANSETRON HCL 4 MG/2ML IJ SOLN
INTRAMUSCULAR | Status: DC | PRN
  Administered 2016-08-15: 4 mg via INTRAVENOUS

## 2016-08-15 MED ORDER — PROPOFOL 10 MG/ML IV BOLUS
INTRAVENOUS | Status: AC
  Filled 2016-08-15: qty 20

## 2016-08-15 MED ORDER — FENTANYL CITRATE (PF) 100 MCG/2ML IJ SOLN
INTRAMUSCULAR | Status: AC
  Filled 2016-08-15: qty 2

## 2016-08-15 MED ORDER — HYDROMORPHONE HCL 1 MG/ML IJ SOLN
INTRAMUSCULAR | Status: AC
  Administered 2016-08-15: 0.5 mg via INTRAVENOUS
  Filled 2016-08-15: qty 1

## 2016-08-15 MED ORDER — PROMETHAZINE HCL 25 MG/ML IJ SOLN: 6.2500 mg | INTRAMUSCULAR | Status: DC | PRN

## 2016-08-15 MED ORDER — MIDAZOLAM HCL 2 MG/2ML IJ SOLN
INTRAMUSCULAR | Status: AC
Start: 2016-08-15 — End: 2016-08-15
  Filled 2016-08-15: qty 2

## 2016-08-15 MED ORDER — OXYCODONE HCL 5 MG/5ML PO SOLN: 5.0000 mg | Freq: Once | ORAL | Status: DC | PRN

## 2016-08-15 MED ORDER — KETOROLAC TROMETHAMINE 30 MG/ML IJ SOLN
INTRAMUSCULAR | Status: AC
  Filled 2016-08-15: qty 1

## 2016-08-15 MED ORDER — PROPOFOL 10 MG/ML IV BOLUS
INTRAVENOUS | Status: DC | PRN
  Administered 2016-08-15: 200 mg via INTRAVENOUS

## 2016-08-15 MED ORDER — MEPERIDINE HCL 25 MG/ML IJ SOLN: 6.2500 mg | INTRAMUSCULAR | Status: DC | PRN

## 2016-08-15 MED ORDER — SODIUM CHLORIDE 0.9 % IR SOLN
Status: DC | PRN
  Administered 2016-08-15: 3000 mL

## 2016-08-15 MED ORDER — SCOPOLAMINE 1 MG/3DAYS TD PT72
MEDICATED_PATCH | TRANSDERMAL | Status: AC
  Administered 2016-08-15: 1.5 mg via TRANSDERMAL
  Filled 2016-08-15: qty 1

## 2016-08-15 MED ORDER — IBUPROFEN 800 MG PO TABS: 800.0000 mg | ORAL_TABLET | Freq: Three times a day (TID) | ORAL | 0 refills | Status: DC | PRN

## 2016-08-15 MED ORDER — DEXAMETHASONE SODIUM PHOSPHATE 10 MG/ML IJ SOLN
INTRAMUSCULAR | Status: DC | PRN
  Administered 2016-08-15: 10 mg via INTRAVENOUS

## 2016-08-15 MED ORDER — ONDANSETRON HCL 4 MG/2ML IJ SOLN
INTRAMUSCULAR | Status: AC
  Filled 2016-08-15: qty 2

## 2016-08-15 MED ORDER — HYDROMORPHONE HCL 1 MG/ML IJ SOLN
0.2500 mg | INTRAMUSCULAR | Status: DC | PRN
  Administered 2016-08-15 (×2): 0.5 mg via INTRAVENOUS

## 2016-08-15 MED ORDER — LIDOCAINE HCL (CARDIAC) 20 MG/ML IV SOLN
INTRAVENOUS | Status: AC
  Filled 2016-08-15: qty 5

## 2016-08-15 MED ORDER — LACTATED RINGERS IV SOLN
INTRAVENOUS | Status: DC
  Administered 2016-08-15: 12:00:00 via INTRAVENOUS

## 2016-08-15 MED ORDER — OXYCODONE HCL 5 MG PO TABS: 5.0000 mg | ORAL_TABLET | Freq: Once | ORAL | Status: DC | PRN

## 2016-08-15 MED ORDER — DEXAMETHASONE SODIUM PHOSPHATE 10 MG/ML IJ SOLN
INTRAMUSCULAR | Status: AC
  Filled 2016-08-15: qty 1

## 2016-08-15 MED ORDER — KETOROLAC TROMETHAMINE 30 MG/ML IJ SOLN
INTRAMUSCULAR | Status: DC | PRN
  Administered 2016-08-15: 30 mg via INTRAVENOUS
  Administered 2016-08-15: 30 mg via INTRAMUSCULAR

## 2016-08-15 MED ORDER — LIDOCAINE 2% (20 MG/ML) 5 ML SYRINGE
INTRAMUSCULAR | Status: DC | PRN
  Administered 2016-08-15: 60 mg via INTRAVENOUS

## 2016-08-15 MED ORDER — SCOPOLAMINE 1 MG/3DAYS TD PT72
1.0000 | MEDICATED_PATCH | Freq: Once | TRANSDERMAL | Status: DC
  Administered 2016-08-15: 1.5 mg via TRANSDERMAL

## 2016-08-15 SURGICAL SUPPLY — 21 items
CANISTER SUCT 3000ML PPV (MISCELLANEOUS) ×5 IMPLANT
CATH ROBINSON RED A/P 16FR (CATHETERS) ×3 IMPLANT
CLOTH BEACON ORANGE TIMEOUT ST (SAFETY) ×3 IMPLANT
CONTAINER PREFILL 10% NBF 60ML (FORM) ×6 IMPLANT
DEVICE MYOSURE LITE (MISCELLANEOUS) IMPLANT
DEVICE MYOSURE REACH (MISCELLANEOUS) IMPLANT
ELECT REM PT RETURN 9FT ADLT (ELECTROSURGICAL) ×3
ELECTRODE REM PT RTRN 9FT ADLT (ELECTROSURGICAL) ×1 IMPLANT
FILTER ARTHROSCOPY CONVERTOR (FILTER) ×3 IMPLANT
GLOVE BIOGEL PI IND STRL 7.0 (GLOVE) ×2 IMPLANT
GLOVE BIOGEL PI INDICATOR 7.0 (GLOVE) ×4
GLOVE ECLIPSE 6.5 STRL STRAW (GLOVE) ×3 IMPLANT
GOWN STRL REUS W/TWL LRG LVL3 (GOWN DISPOSABLE) ×6 IMPLANT
MYOSURE XL FIBROID REM (MISCELLANEOUS) ×3
PACK VAGINAL MINOR WOMEN LF (CUSTOM PROCEDURE TRAY) ×3 IMPLANT
PAD OB MATERNITY 4.3X12.25 (PERSONAL CARE ITEMS) ×3 IMPLANT
SEAL ROD LENS SCOPE MYOSURE (ABLATOR) ×3 IMPLANT
SYSTEM TISS REMOVAL MYSR XL RM (MISCELLANEOUS) IMPLANT
TOWEL OR 17X24 6PK STRL BLUE (TOWEL DISPOSABLE) ×6 IMPLANT
TUBING AQUILEX INFLOW (TUBING) ×5 IMPLANT
TUBING AQUILEX OUTFLOW (TUBING) ×3 IMPLANT

## 2016-08-15 NOTE — Transfer of Care (Signed)
Immediate Anesthesia Transfer of Care Note  Patient: Audrey Bell  Procedure(s) Performed: Procedure(s): DILATATION & CURETTAGE/HYSTEROSCOPY WITH MYOSURE (N/A)  Patient Location: PACU  Anesthesia Type:General  Level of Consciousness: awake  Airway & Oxygen Therapy: Patient Spontanous Breathing and Patient connected to nasal cannula oxygen  Post-op Assessment: Report given to RN and Post -op Vital signs reviewed and stable  Post vital signs: stable  Last Vitals:  Vitals:   08/15/16 1120 08/15/16 1355  BP: (!) 136/97 (!) 127/92  Pulse: 76 95  Resp: 16 16  Temp: 37 C 36.9 C    Last Pain:  Vitals:   08/15/16 1120  TempSrc: Oral      Patients Stated Pain Goal: 4 (96/22/29 7989)  Complications: No apparent anesthesia complications

## 2016-08-15 NOTE — Anesthesia Postprocedure Evaluation (Signed)
Anesthesia Post Note  Patient: Audrey Bell  Procedure(s) Performed: Procedure(s) (LRB): DILATATION & CURETTAGE/HYSTEROSCOPY WITH MYOSURE RESECTION OF FIBROIDS (N/A)  Patient location during evaluation: PACU Anesthesia Type: General Level of consciousness: awake and alert Pain management: pain level controlled Vital Signs Assessment: post-procedure vital signs reviewed and stable Respiratory status: spontaneous breathing, nonlabored ventilation and respiratory function stable Cardiovascular status: blood pressure returned to baseline and stable Postop Assessment: no signs of nausea or vomiting Anesthetic complications: no        Last Vitals:  Vitals:   08/15/16 1445 08/15/16 1500  BP: 124/85 120/83  Pulse: 79 81  Resp: 14 17  Temp:      Last Pain:  Vitals:   08/15/16 1445  TempSrc:   PainSc: 1    Pain Goal: Patients Stated Pain Goal: 4 (08/15/16 1120)               Lynda Rainwater

## 2016-08-15 NOTE — Anesthesia Procedure Notes (Signed)
Procedure Name: LMA Insertion Date/Time: 08/15/2016 1:13 PM Performed by: Barkley Boards L Pre-anesthesia Checklist: Patient identified, Emergency Drugs available, Suction available and Patient being monitored Patient Re-evaluated:Patient Re-evaluated prior to inductionOxygen Delivery Method: Circle system utilized Preoxygenation: Pre-oxygenation with 100% oxygen Intubation Type: IV induction Ventilation: Mask ventilation without difficulty LMA: LMA inserted LMA Size: 4.0 Number of attempts: 1 Dental Injury: Teeth and Oropharynx as per pre-operative assessment

## 2016-08-15 NOTE — Anesthesia Preprocedure Evaluation (Signed)
Anesthesia Evaluation  Patient identified by MRN, date of birth, ID band Patient awake    Reviewed: Allergy & Precautions, NPO status , Patient's Chart, lab work & pertinent test results  Airway Mallampati: II  TM Distance: >3 FB Neck ROM: Full    Dental no notable dental hx.    Pulmonary neg pulmonary ROS, asthma ,    Pulmonary exam normal breath sounds clear to auscultation       Cardiovascular negative cardio ROS Normal cardiovascular exam Rhythm:Regular Rate:Normal     Neuro/Psych negative neurological ROS  negative psych ROS   GI/Hepatic negative GI ROS, Neg liver ROS,   Endo/Other  negative endocrine ROS  Renal/GU negative Renal ROS  negative genitourinary   Musculoskeletal negative musculoskeletal ROS (+) Arthritis ,   Abdominal   Peds negative pediatric ROS (+)  Hematology negative hematology ROS (+) anemia ,   Anesthesia Other Findings   Reproductive/Obstetrics negative OB ROS                             Anesthesia Physical Anesthesia Plan  ASA: II  Anesthesia Plan: General   Post-op Pain Management:    Induction: Intravenous  Airway Management Planned: LMA  Additional Equipment:   Intra-op Plan:   Post-operative Plan:   Informed Consent: I have reviewed the patients History and Physical, chart, labs and discussed the procedure including the risks, benefits and alternatives for the proposed anesthesia with the patient or authorized representative who has indicated his/her understanding and acceptance.   Dental advisory given  Plan Discussed with: CRNA  Anesthesia Plan Comments:         Anesthesia Quick Evaluation

## 2016-08-15 NOTE — Brief Op Note (Signed)
08/15/2016  2:10 PM  PATIENT:  Audrey Bell  41 y.o. female  PRE-OPERATIVE DIAGNOSIS:  Atypical Endometrial Cells, Break thru Bleeding/Menorrhagia, Fibroid Uterus  POST-OPERATIVE DIAGNOSIS:  ATYPICAL ENDOMETRIAL CELLS,MENORRHAGIA, FIBROID UTERUS  PROCEDURE:  DIAGNOSTIC HYSTEROSCOPY, HYSTEROSCOPIC RESECTION OF SUBMUCOSAL FIBROID, DILATION AND CURETTAGE  SURGEON:  Surgeon(s) and Role:    * Corporate treasurer, MD - Primary  PHYSICIAN ASSISTANT:   ASSISTANTS: none   ANESTHESIA:   general FINDINGS; multiple SM fibroids, large fibroid uterus( 14 -15 weeks size, tubal ostia could not seen EBL:  Total I/O In: 700 [I.V.:700] Out: 105 [Urine:100; Blood:5]  BLOOD ADMINISTERED:none  DRAINS: none   LOCAL MEDICATIONS USED:  NONE  SPECIMEN:  Source of Specimen:  emc with fibroid resection  DISPOSITION OF SPECIMEN:  PATHOLOGY  COUNTS:  YES  TOURNIQUET:  * No tourniquets in log *  DICTATION: .Other Dictation: Dictation Number [id number]  PLAN OF CARE: Discharge to home after PACU  PATIENT DISPOSITION:  PACU - hemodynamically stable.   Delay start of Pharmacological VTE agent (>24hrs) due to surgical blood loss or risk of bleeding: no

## 2016-08-15 NOTE — H&P (Signed)
Audrey Bell is an 41 y.o. female. G2P2 BF with symptomatic uterine fibroid presents for dx hysteroscopy, D&C 2nd to atypical endometrial cells on ebx. Pt had been placed on OCP by her prior gyb MD for heavy cycle which stil had heavy cycle. EBX done for planned [place]  Pertinent Gynecological History: Menses: heavy Bleeding: menorrhagia Contraception: OCP (estrogen/progesterone) DES exposure: denies Blood transfusions: none Sexually transmitted diseases: no past history Previous GYN Procedures: none  Last mammogram: normal Date: 10/2015 Last pap: abnormal: (+) HPV, neg pap Date: 10/2015 OB History: G2, P2   Menstrual History: Menarche age: n/a Patient's last menstrual period was 07/27/2016 (exact date).    Past Medical History:  Diagnosis Date  . Anemia   . Asthma   . Fibroids   . Neuromuscular disorder ([place])   . Rheumatoid arthritis ([place])   . Sjoegren syndrome ([place]) 2015    Past Surgical History:  Procedure Laterality Date  . IR GENERIC HISTORICAL  06/23/2016   IR RADIOLOGIST EVAL & MGMT 06/23/2016 Marybelle Killings, MD [facility] INTERV RAD  . TONSILLECTOMY    . WISDOM TOOTH EXTRACTION      History reviewed. No pertinent family history.  Social History:  reports that she has never smoked. She has never used smokeless tobacco. She reports that she does not drink alcohol or use drugs.  Allergies:  Allergies  Allergen Reactions  . Shellfish Allergy Itching    Prescriptions Prior to Admission  Medication Sig Dispense Refill Last Dose  . loratadine (CLARITIN) 10 MG tablet Take 10 mg by mouth daily as needed for allergies.   Past Week at Unknown time  . methylPREDNISolone (MEDROL) 4 MG tablet Take 4 mg by mouth 2 (two) times daily.   08/15/2016 at 0800  . naproxen sodium (ANAPROX) 220 MG tablet Take 440 mg by mouth 2 (two) times daily as needed (pain).    Unknown at Unknown time  . Norethindrone Acetate-Ethinyl Estrad-FE (JUNEL FE 24) 1-20 MG-MCG(24) tablet Take 1 tablet by mouth.     08/14/2016 at Unknown time  . sulfaSALAzine (AZULFIDINE) 500 MG tablet Take 500 mg by mouth 2 (two) times daily.    08/15/2016 at 0800  . Tofacitinib Citrate (XELJANZ) 5 MG TABS Take 5 mg by mouth 2 (two) times daily.    08/15/2016 at 0800  . guaiFENesin-codeine (ROBITUSSIN AC) 100-10 MG/5ML syrup Take 10 mLs by mouth 4 (four) times daily as needed for cough. (Patient not taking: Reported on 08/10/2016) 180 mL 0 Completed Course at Unknown time  . ipratropium (ATROVENT) 0.06 % nasal spray Place 2 sprays into both nostrils 4 (four) times daily. (Patient not taking: Reported on 08/10/2016) 15 mL 1 Completed Course at Unknown time  . predniSONE (DELTASONE) 50 MG tablet 1 tab PO QD for 5 days (Patient not taking: Reported on 08/10/2016) 5 tablet 0 Completed Course at Unknown time    Review of Systems  All other systems reviewed and are negative.   Blood pressure (!) 136/97, pulse 76, temperature 98.6 F (37 C), temperature source Oral, resp. rate 16, height 5\' 8"  (1.727 m), weight 74.4 kg (164 lb), last menstrual period 07/27/2016, SpO2 100 %. Physical Exam  Constitutional: She is oriented to person, place, and time. She appears well-developed and well-nourished.  HENT:  Head: Atraumatic.  Eyes: EOM are normal.  Neck: Neck supple.  Cardiovascular: Regular rhythm.   Respiratory: Breath sounds normal.  GI: Soft.  Genitourinary: Vagina normal.  Neurological: She is alert and oriented to person, place, and  time.  Skin: Skin is warm and dry.  Psychiatric: She has a normal mood and affect.  vulva nl ' Cervix parous Uterus enlarged irreg(fibroid)  Results for orders placed or performed during the hospital encounter of 08/15/16 (from the past 24 hour(s))  CBC     Status: None   Collection Time: 08/15/16 11:13 AM  Result Value Ref Range   WBC 5.9 4.0 - 10.5 K/uL   RBC 3.96 3.87 - 5.11 MIL/uL   Hemoglobin 12.2 12.0 - 15.0 g/dL   HCT 36.8 36.0 - 46.0 %   MCV 92.9 78.0 - 100.0 fL   MCH 30.8 26.0 -  34.0 pg   MCHC 33.2 30.0 - 36.0 g/dL   RDW 14.5 11.5 - 15.5 %   Platelets 355 150 - 400 K/uL    No results found.  Assessment/Plan: Atypical endometrial cells  Menorrhagia with regular cycles Fibroid uterus P) dx hysteroscopy, D&C. Risk of surgery includes infection, bleeding, uterine perforation and its risk, thermal injury, fluid overload and its risk. All ? answered  Nishanth Mccaughan A 08/15/2016, 12:47 PM

## 2016-08-15 NOTE — Discharge Instructions (Signed)
DISCHARGE INSTRUCTIONS: HYSTEROSCOPY / ENDOMETRIAL ABLATION The following instructions have been prepared to help you care for yourself upon your return home.  May Remove Scop patch on or before  May take Ibuprofen after  May take stool softner while taking narcotic pain medication to prevent constipation.  Drink plenty of water.  Personal hygiene:  Use sanitary pads for vaginal drainage, not tampons.  Shower the day after your procedure.  NO tub baths, pools or Jacuzzis for 2-3 weeks.  Wipe front to back after using the bathroom.  Activity and limitations:  Do NOT drive or operate any equipment for 24 hours. The effects of anesthesia are still present and drowsiness may result.  Do NOT rest in bed all day.  Walking is encouraged.  Walk up and down stairs slowly.  You may resume your normal activity in one to two days or as indicated by your physician. Sexual activity: NO intercourse for at least 2 weeks after the procedure, or as indicated by your Doctor.  Diet: Eat a light meal as desired this evening. You may resume your usual diet tomorrow.  Return to Work: You may resume your work activities in one to two days or as indicated by Marine scientist.  What to expect after your surgery: Expect to have vaginal bleeding/discharge for 2-3 days and spotting for up to 10 days. It is not unusual to have soreness for up to 1-2 weeks. You may have a slight burning sensation when you urinate for the first day. Mild cramps may continue for a couple of days. You may have a regular period in 2-6 weeks.  Call your doctor for any of the following:  Excessive vaginal bleeding or clotting, saturating and changing one pad every hour.  Inability to urinate 6 hours after discharge from hospital.  Pain not relieved by pain medication.  Fever of 100.4 F or greater.  Unusual vaginal discharge or odor.  Return to office _________________Call for an appointment  ___________________ Patients signature: ______________________ Nurses signature ________________________  Post Anesthesia Care Unit [phone] Post Anesthesia Home Care Instructions  Activity: Get plenty of rest for the remainder of the day. A responsible individual must stay with you for 24 hours following the procedure.  For the next 24 hours, DO NOT: -Drive a car -Paediatric nurse -Drink alcoholic beverages -Take any medication unless instructed by your physician -Make any legal decisions or sign important papers.  Meals: Start with liquid foods such as gelatin or soup. Progress to regular foods as tolerated. Avoid greasy, spicy, heavy foods. If nausea and/or vomiting occur, drink only clear liquids until the nausea and/or vomiting subsides. Call your physician if vomiting continues.  Special Instructions/Symptoms: Your throat may feel dry or sore from the anesthesia or the breathing tube placed in your throat during surgery. If this causes discomfort, gargle with warm salt water. The discomfort should disappear within 24 hours.  If you had a scopolamine patch placed behind your ear for the management of post- operative nausea and/or vomiting:  1. The medication in the patch is effective for 72 hours, after which it should be removed.  Wrap patch in a tissue and discard in the trash. Wash hands thoroughly with soap and water. 2. You may remove the patch earlier than 72 hours if you experience unpleasant side effects which may include dry mouth, dizziness or visual disturbances. 3. Avoid touching the patch. Wash your hands with soap and water after contact with the patch.   CALL  IF TEMP>100.4, NOTHING PER  VAGINA X 2 WK, CALL IF SOAKING A MAXI  PAD EVERY HOUR OR MORE FREQUENTLY °

## 2016-08-16 ENCOUNTER — Encounter (HOSPITAL_COMMUNITY): Payer: Self-pay | Admitting: Obstetrics and Gynecology

## 2016-08-16 NOTE — Op Note (Signed)
NAME:  Audrey Bell, Audrey Bell                   ACCOUNT NO.:  MEDICAL RECORD NO.:  [id number]  LOCATION:                                 FACILITY:  PHYSICIAN:  Servando Salina, M.D.    DATE OF BIRTH:  DATE OF PROCEDURE:  08/15/2016 DATE OF DISCHARGE:                              OPERATIVE REPORT   PREOPERATIVE DIAGNOSES: 1. Atypical endometrial cells. 2. Fibroid uterus. 3. Breakthrough bleeding on birth control pills.  PROCEDURE:  Diagnostic hysteroscopy, hysteroscopic resection of submucosal fibroids, dilation and curettage.  POSTOPERATIVE DIAGNOSES: 1. Multiple submucosal fibroids. 2. Atypical endometrial cells. 3. Breakthrough bleeding on OCP.  ANESTHESIA:  General.  SURGEON:  Servando Salina, M.D.  ASSISTANT:  None.  DESCRIPTION OF PROCEDURE:  Under adequate general anesthesia, the patient was placed in the dorsal lithotomy position.  She was sterilely prepped and draped in usual fashion.  Bladder was catheterized for moderate amount of urine.  Examination under anesthesia revealed about a 14 week size uterus, irregular contour due to the fibroid.  No adnexal masses could be appreciated, but limited by the enlarged uterus. Bivalve speculum was placed in the vagina.  Single-tooth tenaculum was placed on anterior lip of the cervix.  The MyoSure hysteroscope was introduced into the uterine cavity.  Limited visibility due to some bleeding that was inside the uterine cavity.  However with some clearing, multiple submucosal fibroids were noted.  At that point, that hysteroscope was removed and the larger resectoscope was then inserted and using the XL REACH, 3 of the fibroids that was seen were resected; however, due to fluid deficit of 2500, the procedure had to be stopped and the resectoscope was removed.  The cavity was then curetted and all instruments were then removed from the vagina.  SPECIMEN:  Labeled endometrial curetting with fibroid resection was sent to  Pathology.  ESTIMATED BLOOD LOSS:  10 mL.  INTRAOPERATIVE FLUIDS:  700 mL.  FLUID DEFICIT:  2 L.  URINE OUTPUT:  100 ml.  COUNTS:  Sponge and instrument counts x2 was correct.  COMPLICATIONS:  None.  The patient tolerated the procedure well, was transferred to recovery room in stable condition.     Servando Salina, M.D.     [place]/MEDQ  D:  08/15/2016  T:  08/16/2016  Job:  [id number]

## 2016-08-22 ENCOUNTER — Ambulatory Visit (HOSPITAL_COMMUNITY): Payer: BC Managed Care – PPO

## 2016-08-22 ENCOUNTER — Other Ambulatory Visit (HOSPITAL_COMMUNITY): Payer: BC Managed Care – PPO

## 2016-10-27 ENCOUNTER — Inpatient Hospital Stay: Admit: 2016-10-27 | Payer: BC Managed Care – PPO | Admitting: Obstetrics and Gynecology

## 2016-10-27 SURGERY — HYSTERECTOMY, TOTAL, ABDOMINAL, WITH SALPINGECTOMY
Anesthesia: General | Laterality: Bilateral

## 2017-09-26 ENCOUNTER — Other Ambulatory Visit: Payer: Self-pay | Admitting: Obstetrics and Gynecology

## 2017-10-09 NOTE — Patient Instructions (Signed)
Audrey Bell  10/09/2017   Your procedure is scheduled on: 10-12-17   Report to [facility] Main  Entrance    Report to Admitting at 11:00 AM    Call this number if you have problems the morning of surgery [phone]   Remember: Do not eat food or drink liquids :After Midnight. You may have a Clear Liquid Diet from midnight until 7:00 AM. After 7:00 AM, nothing until after surgery.     CLEAR LIQUID DIET   Foods Allowed                                                                     Foods Excluded  Coffee and tea, regular and decaf                             liquids that you cannot  Plain Jell-O in any flavor                                             see through such as: Fruit ices (not with fruit pulp)                                     milk, soups, orange juice  Iced Popsicles                                    All solid food Carbonated beverages, regular and diet                                    Cranberry, grape and apple juices Sports drinks like Gatorade Lightly seasoned clear broth or consume(fat free) Sugar, honey syrup  Sample Menu Breakfast                                Lunch                                     Supper Cranberry juice                    Beef broth                            Chicken broth Jell-O                                     Grape juice  Apple juice Coffee or tea                        Jell-O                                      Popsicle                                                Coffee or tea                        Coffee or tea  _____________________________________________________________________     Take these medicines the morning of surgery with A SIP OF WATER: None               You may not have any metal on your body including hair pins and              piercings  Do not wear jewelry, make-up, lotions, powders or perfumes, deodorant             Do not wear nail polish.   Do not shave  48 hours prior to surgery.                Do not bring valuables to the hospital. [place].  Contacts, dentures or bridgework may not be worn into surgery.  Leave suitcase in the car. After surgery it may be brought to your room.      Special Instructions: N/A              Please read over the following fact sheets you were given: _____________________________________________________________________           [facility] - Preparing for Surgery Before surgery, you can play an important role.  Because skin is not sterile, your skin needs to be as free of germs as possible.  You can reduce the number of germs on your skin by washing with CHG (chlorahexidine gluconate) soap before surgery.  CHG is an antiseptic cleaner which kills germs and bonds with the skin to continue killing germs even after washing. Please DO NOT use if you have an allergy to CHG or antibacterial soaps.  If your skin becomes reddened/irritated stop using the CHG and inform your nurse when you arrive at Short Stay. Do not shave (including legs and underarms) for at least 48 hours prior to the first CHG shower.  You may shave your face/neck. Please follow these instructions carefully:  1.  Shower with CHG Soap the night before surgery and the  morning of Surgery.  2.  If you choose to wash your hair, wash your hair first as usual with your  normal  shampoo.  3.  After you shampoo, rinse your hair and body thoroughly to remove the  shampoo.                           4.  Use CHG as you would any other liquid soap.  You can apply chg directly  to the skin and wash  Gently with a scrungie or clean washcloth.  5.  Apply the CHG Soap to your body ONLY FROM THE NECK DOWN.   Do not use on face/ open                           Wound or open sores. Avoid contact with eyes, ears mouth and genitals (private parts).                       Wash face,   Genitals (private parts) with your normal soap.             6.  Wash thoroughly, paying special attention to the area where your surgery  will be performed.  7.  Thoroughly rinse your body with warm water from the neck down.  8.  DO NOT shower/wash with your normal soap after using and rinsing off  the CHG Soap.                9.  Pat yourself dry with a clean towel.            10.  Wear clean pajamas.            11.  Place clean sheets on your bed the night of your first shower and do not  sleep with pets. Day of Surgery : Do not apply any lotions/deodorants the morning of surgery.  Please wear clean clothes to the hospital/surgery center.  FAILURE TO FOLLOW THESE INSTRUCTIONS MAY RESULT IN THE CANCELLATION OF YOUR SURGERY PATIENT SIGNATURE_________________________________  NURSE SIGNATURE__________________________________  ________________________________________________________________________  WHAT IS A BLOOD TRANSFUSION? Blood Transfusion Information  A transfusion is the replacement of blood or some of its parts. Blood is made up of multiple cells which provide different functions.  Red blood cells carry oxygen and are used for blood loss replacement.  White blood cells fight against infection.  Platelets control bleeding.  Plasma helps clot blood.  Other blood products are available for specialized needs, such as hemophilia or other clotting disorders. BEFORE THE TRANSFUSION  Who gives blood for transfusions?   Healthy volunteers who are fully evaluated to make sure their blood is safe. This is blood bank blood. Transfusion therapy is the safest it has ever been in the practice of medicine. Before blood is taken from a donor, a complete history is taken to make sure that person has no history of diseases nor engages in risky social behavior (examples are intravenous drug use or sexual activity with multiple partners). The donor's travel history is screened to minimize risk of  transmitting infections, such as malaria. The donated blood is tested for signs of infectious diseases, such as HIV and hepatitis. The blood is then tested to be sure it is compatible with you in order to minimize the chance of a transfusion reaction. If you or a relative donates blood, this is often done in anticipation of surgery and is not appropriate for emergency situations. It takes many days to process the donated blood. RISKS AND COMPLICATIONS Although transfusion therapy is very safe and saves many lives, the main dangers of transfusion include:   Getting an infectious disease.  Developing a transfusion reaction. This is an allergic reaction to something in the blood you were given. Every precaution is taken to prevent this. The decision to have a blood transfusion has been considered carefully by your caregiver before blood is given. Blood is not given unless the benefits  outweigh the risks. AFTER THE TRANSFUSION  Right after receiving a blood transfusion, you will usually feel much better and more energetic. This is especially true if your red blood cells have gotten low (anemic). The transfusion raises the level of the red blood cells which carry oxygen, and this usually causes an energy increase.  The nurse administering the transfusion will monitor you carefully for complications. HOME CARE INSTRUCTIONS  No special instructions are needed after a transfusion. You may find your energy is better. Speak with your caregiver about any limitations on activity for underlying diseases you may have. SEEK MEDICAL CARE IF:   Your condition is not improving after your transfusion.  You develop redness or irritation at the intravenous (IV) site. SEEK IMMEDIATE MEDICAL CARE IF:  Any of the following symptoms occur over the next 12 hours:  Shaking chills.  You have a temperature by mouth above 102 F (38.9 C), not controlled by medicine.  Chest, back, or muscle pain.  People around you  feel you are not acting correctly or are confused.  Shortness of breath or difficulty breathing.  Dizziness and fainting.  You get a rash or develop hives.  You have a decrease in urine output.  Your urine turns a dark color or changes to pink, red, or brown. Any of the following symptoms occur over the next 10 days:  You have a temperature by mouth above 102 F (38.9 C), not controlled by medicine.  Shortness of breath.  Weakness after normal activity.  The white part of the eye turns yellow (jaundice).  You have a decrease in the amount of urine or are urinating less often.  Your urine turns a dark color or changes to pink, red, or brown. Document Released: 04/15/2000 Document Revised: 07/11/2011 Document Reviewed: 12/03/2007 [facility] Patient Information 2014 [place], [place].  _______________________________________________________________________

## 2017-10-10 ENCOUNTER — Encounter (HOSPITAL_COMMUNITY)
Admission: RE | Admit: 2017-10-10 | Discharge: 2017-10-10 | Disposition: A | Discharge status: Home / Self Care | Payer: BC Managed Care – PPO | Source: Ambulatory Visit | Attending: Obstetrics and Gynecology | Admitting: Obstetrics and Gynecology

## 2017-10-10 ENCOUNTER — Other Ambulatory Visit: Payer: Self-pay

## 2017-10-10 ENCOUNTER — Encounter (HOSPITAL_COMMUNITY): Payer: Self-pay

## 2017-10-10 DIAGNOSIS — D259 Leiomyoma of uterus, unspecified: Secondary | ICD-10-CM | POA: Insufficient documentation

## 2017-10-10 DIAGNOSIS — Z01812 Encounter for preprocedural laboratory examination: Secondary | ICD-10-CM | POA: Insufficient documentation

## 2017-10-10 LAB — CBC
HEMATOCRIT: 35.7 % — AB (ref 36.0–46.0)
Hemoglobin: 11.3 g/dL — ABNORMAL LOW (ref 12.0–15.0)
MCH: 27.9 pg (ref 26.0–34.0)
MCHC: 31.7 g/dL (ref 30.0–36.0)
MCV: 88.1 fL (ref 78.0–100.0)
PLATELETS: 355 10*3/uL (ref 150–400)
RBC: 4.05 MIL/uL (ref 3.87–5.11)
RDW: 15.2 % (ref 11.5–15.5)
WBC: 5.3 10*3/uL (ref 4.0–10.5)

## 2017-10-10 LAB — BASIC METABOLIC PANEL
Anion gap: 5 (ref 5–15)
BUN: 12 mg/dL (ref 6–20)
CHLORIDE: 107 mmol/L (ref 101–111)
CO2: 27 mmol/L (ref 22–32)
CREATININE: 0.73 mg/dL (ref 0.44–1.00)
Calcium: 9 mg/dL (ref 8.9–10.3)
GFR calc Af Amer: >60 mL/min (ref >60)
GFR calc non Af Amer: >60 mL/min (ref >60)
GLUCOSE: 87 mg/dL (ref 65–99)
POTASSIUM: 4 mmol/L (ref 3.5–5.1)
SODIUM: 139 mmol/L (ref 135–145)

## 2017-10-10 LAB — ABO/RH: ABO/RH(D): O POS

## 2017-10-12 ENCOUNTER — Encounter (HOSPITAL_COMMUNITY)
Admission: RE | Disposition: A | Discharge status: Home / Self Care | Payer: Self-pay | Source: Ambulatory Visit | Attending: Obstetrics and Gynecology

## 2017-10-12 ENCOUNTER — Ambulatory Visit (HOSPITAL_COMMUNITY): Payer: BC Managed Care – PPO | Admitting: Anesthesiology

## 2017-10-12 ENCOUNTER — Encounter (HOSPITAL_COMMUNITY): Payer: Self-pay | Admitting: Anesthesiology

## 2017-10-12 ENCOUNTER — Ambulatory Visit (HOSPITAL_COMMUNITY)
Admission: RE | Admit: 2017-10-12 | Discharge: 2017-10-13 | Disposition: A | Discharge status: Home / Self Care | Payer: BC Managed Care – PPO | Source: Ambulatory Visit | Attending: Obstetrics and Gynecology | Admitting: Obstetrics and Gynecology

## 2017-10-12 ENCOUNTER — Other Ambulatory Visit: Payer: Self-pay

## 2017-10-12 DIAGNOSIS — N838 Other noninflammatory disorders of ovary, fallopian tube and broad ligament: Secondary | ICD-10-CM | POA: Diagnosis not present

## 2017-10-12 DIAGNOSIS — M069 Rheumatoid arthritis, unspecified: Secondary | ICD-10-CM | POA: Diagnosis not present

## 2017-10-12 DIAGNOSIS — Z79899 Other long term (current) drug therapy: Secondary | ICD-10-CM | POA: Diagnosis not present

## 2017-10-12 DIAGNOSIS — D259 Leiomyoma of uterus, unspecified: Secondary | ICD-10-CM | POA: Diagnosis present

## 2017-10-12 DIAGNOSIS — D649 Anemia, unspecified: Secondary | ICD-10-CM | POA: Insufficient documentation

## 2017-10-12 DIAGNOSIS — Z90710 Acquired absence of both cervix and uterus: Secondary | ICD-10-CM | POA: Diagnosis present

## 2017-10-12 HISTORY — PX: ROBOTIC ASSISTED LAPAROSCOPIC HYSTERECTOMY AND SALPINGECTOMY: SHX6379

## 2017-10-12 LAB — TYPE AND SCREEN
ABO/RH(D): O POS
Antibody Screen: NEGATIVE

## 2017-10-12 SURGERY — XI ROBOTIC ASSISTED LAPAROSCOPIC HYSTERECTOMY AND SALPINGECTOMY
Anesthesia: General | Site: Abdomen | Laterality: Bilateral

## 2017-10-12 MED ORDER — MIDAZOLAM HCL 2 MG/2ML IJ SOLN
INTRAMUSCULAR | Status: AC
  Filled 2017-10-12: qty 2

## 2017-10-12 MED ORDER — SUGAMMADEX SODIUM 200 MG/2ML IV SOLN
INTRAVENOUS | Status: DC | PRN
  Administered 2017-10-12: 200 mg via INTRAVENOUS

## 2017-10-12 MED ORDER — HYDROMORPHONE HCL 1 MG/ML IJ SOLN
0.2000 mg | INTRAMUSCULAR | Status: DC | PRN
  Administered 2017-10-12: 0.5 mg via INTRAVENOUS

## 2017-10-12 MED ORDER — PHENYLEPHRINE HCL 10 MG/ML IJ SOLN
INTRAMUSCULAR | Status: DC | PRN
  Administered 2017-10-12: 120 ug via INTRAVENOUS

## 2017-10-12 MED ORDER — TOFACITINIB CITRATE 5 MG PO TABS
5.0000 mg | ORAL_TABLET | Freq: Two times a day (BID) | ORAL | Status: DC
  Administered 2017-10-12: 5 mg via ORAL

## 2017-10-12 MED ORDER — FENTANYL CITRATE (PF) 100 MCG/2ML IJ SOLN
INTRAMUSCULAR | Status: AC
  Filled 2017-10-12: qty 2

## 2017-10-12 MED ORDER — PROMETHAZINE HCL 25 MG/ML IJ SOLN: 6.2500 mg | INTRAMUSCULAR | Status: DC | PRN

## 2017-10-12 MED ORDER — PANTOPRAZOLE SODIUM 40 MG PO TBEC
DELAYED_RELEASE_TABLET | ORAL | Status: AC
  Filled 2017-10-12: qty 1

## 2017-10-12 MED ORDER — LACTATED RINGERS IV SOLN
INTRAVENOUS | Status: DC
Start: 2017-10-12 — End: 2017-10-12
  Administered 2017-10-12 (×2): via INTRAVENOUS

## 2017-10-12 MED ORDER — ONDANSETRON HCL 4 MG/2ML IJ SOLN
INTRAMUSCULAR | Status: DC | PRN
  Administered 2017-10-12: 4 mg via INTRAVENOUS

## 2017-10-12 MED ORDER — DEXTROSE IN LACTATED RINGERS 5 % IV SOLN
INTRAVENOUS | Status: DC
  Administered 2017-10-12: 22:00:00 via INTRAVENOUS
  Filled 2017-10-12 (×5): qty 1000

## 2017-10-12 MED ORDER — MENTHOL 3 MG MT LOZG: 1.0000 | LOZENGE | OROMUCOSAL | Status: DC | PRN

## 2017-10-12 MED ORDER — PROPOFOL 10 MG/ML IV BOLUS
INTRAVENOUS | Status: AC
  Filled 2017-10-12: qty 20

## 2017-10-12 MED ORDER — PHENYLEPHRINE 40 MCG/ML (10ML) SYRINGE FOR IV PUSH (FOR BLOOD PRESSURE SUPPORT)
PREFILLED_SYRINGE | INTRAVENOUS | Status: AC
  Filled 2017-10-12: qty 10

## 2017-10-12 MED ORDER — MIDAZOLAM HCL 2 MG/2ML IJ SOLN
INTRAMUSCULAR | Status: DC | PRN
  Administered 2017-10-12: 2 mg via INTRAVENOUS

## 2017-10-12 MED ORDER — ALBUMIN HUMAN 5 % IV SOLN
INTRAVENOUS | Status: DC | PRN
  Administered 2017-10-12 (×2): via INTRAVENOUS

## 2017-10-12 MED ORDER — FENTANYL CITRATE (PF) 250 MCG/5ML IJ SOLN
INTRAMUSCULAR | Status: AC
  Filled 2017-10-12: qty 5

## 2017-10-12 MED ORDER — PROPOFOL 10 MG/ML IV BOLUS
INTRAVENOUS | Status: DC | PRN
  Administered 2017-10-12: 200 mg via INTRAVENOUS

## 2017-10-12 MED ORDER — OXYCODONE-ACETAMINOPHEN 5-325 MG PO TABS
2.0000 | ORAL_TABLET | ORAL | Status: DC | PRN
  Administered 2017-10-13: 2 via ORAL

## 2017-10-12 MED ORDER — ACETAMINOPHEN 10 MG/ML IV SOLN
INTRAVENOUS | Status: DC | PRN
  Administered 2017-10-12: 1000 mg via INTRAVENOUS

## 2017-10-12 MED ORDER — IPRATROPIUM-ALBUTEROL 20-100 MCG/ACT IN AERS: 1.0000 | INHALATION_SPRAY | Freq: Four times a day (QID) | RESPIRATORY_TRACT | Status: DC | PRN

## 2017-10-12 MED ORDER — HYDROMORPHONE HCL 1 MG/ML IJ SOLN
INTRAMUSCULAR | Status: AC
  Filled 2017-10-12: qty 1

## 2017-10-12 MED ORDER — SCOPOLAMINE 1 MG/3DAYS TD PT72
MEDICATED_PATCH | TRANSDERMAL | Status: AC
  Filled 2017-10-12: qty 1

## 2017-10-12 MED ORDER — SCOPOLAMINE 1 MG/3DAYS TD PT72
MEDICATED_PATCH | TRANSDERMAL | Status: DC | PRN
  Administered 2017-10-12: 1 via TRANSDERMAL

## 2017-10-12 MED ORDER — ROCURONIUM BROMIDE 10 MG/ML (PF) SYRINGE
PREFILLED_SYRINGE | INTRAVENOUS | Status: DC | PRN
  Administered 2017-10-12 (×2): 20 mg via INTRAVENOUS
  Administered 2017-10-12: 40 mg via INTRAVENOUS
  Administered 2017-10-12: 20 mg via INTRAVENOUS

## 2017-10-12 MED ORDER — KETOROLAC TROMETHAMINE 30 MG/ML IJ SOLN
INTRAMUSCULAR | Status: AC
  Filled 2017-10-12: qty 1

## 2017-10-12 MED ORDER — SODIUM CHLORIDE 0.9 % IJ SOLN
INTRAMUSCULAR | Status: AC
  Filled 2017-10-12: qty 200

## 2017-10-12 MED ORDER — PANTOPRAZOLE SODIUM 40 MG PO TBEC
40.0000 mg | DELAYED_RELEASE_TABLET | Freq: Every day | ORAL | Status: DC
  Administered 2017-10-12: 40 mg via ORAL
  Filled 2017-10-12: qty 1

## 2017-10-12 MED ORDER — DEXAMETHASONE SODIUM PHOSPHATE 10 MG/ML IJ SOLN
INTRAMUSCULAR | Status: AC
  Filled 2017-10-12: qty 1

## 2017-10-12 MED ORDER — DEXAMETHASONE SODIUM PHOSPHATE 10 MG/ML IJ SOLN
INTRAMUSCULAR | Status: DC | PRN
  Administered 2017-10-12: 10 mg via INTRAVENOUS

## 2017-10-12 MED ORDER — SCOPOLAMINE 1 MG/3DAYS TD PT72
MEDICATED_PATCH | TRANSDERMAL | Status: AC
  Administered 2017-10-12: 1.5 mg
  Filled 2017-10-12: qty 1

## 2017-10-12 MED ORDER — METHYLPREDNISOLONE 2 MG PO TABS
4.0000 mg | ORAL_TABLET | Freq: Every day | ORAL | Status: DC
  Administered 2017-10-12: 4 mg via ORAL
  Filled 2017-10-12: qty 2

## 2017-10-12 MED ORDER — SIMETHICONE 80 MG PO CHEW
80.0000 mg | CHEWABLE_TABLET | Freq: Four times a day (QID) | ORAL | Status: DC | PRN
  Filled 2017-10-12: qty 1

## 2017-10-12 MED ORDER — ALBUMIN HUMAN 5 % IV SOLN
INTRAVENOUS | Status: AC
  Filled 2017-10-12: qty 250

## 2017-10-12 MED ORDER — ONDANSETRON HCL 4 MG PO TABS
4.0000 mg | ORAL_TABLET | Freq: Four times a day (QID) | ORAL | Status: DC | PRN
  Filled 2017-10-12: qty 1

## 2017-10-12 MED ORDER — FENTANYL CITRATE (PF) 100 MCG/2ML IJ SOLN
25.0000 ug | INTRAMUSCULAR | Status: DC | PRN
  Administered 2017-10-12 (×3): 50 ug via INTRAVENOUS

## 2017-10-12 MED ORDER — ACETAMINOPHEN 10 MG/ML IV SOLN
INTRAVENOUS | Status: AC
  Filled 2017-10-12: qty 100

## 2017-10-12 MED ORDER — IBUPROFEN 200 MG PO TABS: 800.0000 mg | ORAL_TABLET | Freq: Three times a day (TID) | ORAL | Status: DC | PRN

## 2017-10-12 MED ORDER — FENTANYL CITRATE (PF) 100 MCG/2ML IJ SOLN
INTRAMUSCULAR | Status: DC | PRN
  Administered 2017-10-12 (×2): 50 ug via INTRAVENOUS
  Administered 2017-10-12: 100 ug via INTRAVENOUS
  Administered 2017-10-12: 50 ug via INTRAVENOUS

## 2017-10-12 MED ORDER — BUPIVACAINE HCL (PF) 0.25 % IJ SOLN
INTRAMUSCULAR | Status: DC | PRN
  Administered 2017-10-12: 10 mL

## 2017-10-12 MED ORDER — LIDOCAINE 2% (20 MG/ML) 5 ML SYRINGE
INTRAMUSCULAR | Status: AC
  Filled 2017-10-12: qty 5

## 2017-10-12 MED ORDER — LIDOCAINE 2% (20 MG/ML) 5 ML SYRINGE
INTRAMUSCULAR | Status: DC | PRN
  Administered 2017-10-12: 80 mg via INTRAVENOUS

## 2017-10-12 MED ORDER — ONDANSETRON HCL 4 MG/2ML IJ SOLN
INTRAMUSCULAR | Status: AC
  Filled 2017-10-12: qty 2

## 2017-10-12 MED ORDER — BUPIVACAINE HCL (PF) 0.25 % IJ SOLN
INTRAMUSCULAR | Status: AC
  Filled 2017-10-12: qty 30

## 2017-10-12 MED ORDER — CEFAZOLIN SODIUM-DEXTROSE 2-4 GM/100ML-% IV SOLN
2.0000 g | INTRAVENOUS | Status: AC
  Administered 2017-10-12: 2 g via INTRAVENOUS
  Filled 2017-10-12: qty 100

## 2017-10-12 MED ORDER — ROPIVACAINE HCL 5 MG/ML IJ SOLN
INTRAMUSCULAR | Status: AC
  Filled 2017-10-12: qty 30

## 2017-10-12 MED ORDER — SUGAMMADEX SODIUM 200 MG/2ML IV SOLN
INTRAVENOUS | Status: AC
  Filled 2017-10-12: qty 2

## 2017-10-12 MED ORDER — HYDROXYZINE HCL 10 MG PO TABS
10.0000 mg | ORAL_TABLET | Freq: Three times a day (TID) | ORAL | Status: DC | PRN
  Filled 2017-10-12: qty 1

## 2017-10-12 MED ORDER — VASOPRESSIN 20 UNIT/ML IV SOLN
INTRAVENOUS | Status: AC
  Filled 2017-10-12: qty 3

## 2017-10-12 MED ORDER — ONDANSETRON HCL 4 MG/2ML IJ SOLN: 4.0000 mg | Freq: Four times a day (QID) | INTRAMUSCULAR | Status: DC | PRN

## 2017-10-12 MED ORDER — ROCURONIUM BROMIDE 10 MG/ML (PF) SYRINGE
PREFILLED_SYRINGE | INTRAVENOUS | Status: AC
  Filled 2017-10-12: qty 5

## 2017-10-12 SURGICAL SUPPLY — 88 items
ADH SKN CLS APL DERMABOND .7 (GAUZE/BANDAGES/DRESSINGS) ×2
APL SKNCLS STERI-STRIP NONHPOA (GAUZE/BANDAGES/DRESSINGS)
APL SRG 38 LTWT LNG FL B (MISCELLANEOUS) ×2
APPLICATOR ARISTA FLEXITIP XL (MISCELLANEOUS) ×3 IMPLANT
BAG URINE DRAINAGE (UROLOGICAL SUPPLIES) ×4 IMPLANT
BARRIER ADHS 3X4 INTERCEED (GAUZE/BANDAGES/DRESSINGS) IMPLANT
BENZOIN TINCTURE PRP APPL 2/3 (GAUZE/BANDAGES/DRESSINGS) ×1 IMPLANT
BRR ADH 4X3 ABS CNTRL BYND (GAUZE/BANDAGES/DRESSINGS)
CANISTER SUCT 3000ML PPV (MISCELLANEOUS) ×4 IMPLANT
CATH FOLEY 3WAY  5CC 16FR (CATHETERS) ×2
CATH FOLEY 3WAY 5CC 16FR (CATHETERS) ×2 IMPLANT
CLOSURE WOUND 1/2 X4 (GAUZE/BANDAGES/DRESSINGS)
CONT PATH 16OZ SNAP LID 3702 (MISCELLANEOUS) ×1 IMPLANT
COVER BACK TABLE 60X90IN (DRAPES) ×4 IMPLANT
COVER TIP SHEARS 8 DVNC (MISCELLANEOUS) ×2 IMPLANT
COVER TIP SHEARS 8MM DA VINCI (MISCELLANEOUS) ×2
DECANTER SPIKE VIAL GLASS SM (MISCELLANEOUS) ×2 IMPLANT
DEFOGGER SCOPE WARMER CLEARIFY (MISCELLANEOUS) ×4 IMPLANT
DERMABOND ADVANCED (GAUZE/BANDAGES/DRESSINGS) ×2
DERMABOND ADVANCED .7 DNX12 (GAUZE/BANDAGES/DRESSINGS) ×2 IMPLANT
DRAPE ARM DVNC X/XI (DISPOSABLE) ×8 IMPLANT
DRAPE CESAREAN BIRTH W POUCH (DRAPES) ×1 IMPLANT
DRAPE COLUMN DVNC XI (DISPOSABLE) ×2 IMPLANT
DRAPE DA VINCI XI ARM (DISPOSABLE) ×8
DRAPE DA VINCI XI COLUMN (DISPOSABLE) ×2
DRAPE WARM FLUID 44X44 (DRAPE) IMPLANT
DRSG OPSITE POSTOP 4X10 (GAUZE/BANDAGES/DRESSINGS) ×1 IMPLANT
DURAPREP 26ML APPLICATOR (WOUND CARE) ×1 IMPLANT
ELECT REM PT RETURN 15FT ADLT (MISCELLANEOUS) ×4 IMPLANT
GAUZE SPONGE 4X4 16PLY XRAY LF (GAUZE/BANDAGES/DRESSINGS) IMPLANT
GLOVE BIOGEL PI IND STRL 7.0 (GLOVE) ×10 IMPLANT
GLOVE BIOGEL PI INDICATOR 7.0 (GLOVE) ×10
GLOVE ECLIPSE 6.5 STRL STRAW (GLOVE) ×12 IMPLANT
GOWN STRL REUS W/TWL LRG LVL3 (GOWN DISPOSABLE) ×9 IMPLANT
HEMOSTAT ARISTA ABSORB 3G PWDR (MISCELLANEOUS) ×3 IMPLANT
IRRIG SUCT STRYKERFLOW 2 WTIP (MISCELLANEOUS) ×4
IRRIGATION SUCT STRKRFLW 2 WTP (MISCELLANEOUS) ×2 IMPLANT
IV NS IRRIG 3000ML ARTHROMATIC (IV SOLUTION) ×3 IMPLANT
LIGASURE IMPACT 36 18CM CVD LR (INSTRUMENTS) IMPLANT
NEEDLE HYPO 22GX1.5 SAFETY (NEEDLE) ×1 IMPLANT
NEEDLE INSUFFLATION 120MM (ENDOMECHANICALS) ×4 IMPLANT
NEEDLE INSUFFLATION 150MM (ENDOMECHANICALS) IMPLANT
NS IRRIG 1000ML POUR BTL (IV SOLUTION) ×4 IMPLANT
OBTURATOR OPTICAL STANDARD 8MM (TROCAR)
OBTURATOR OPTICAL STND 8 DVNC (TROCAR)
OBTURATOR OPTICALSTD 8 DVNC (TROCAR) IMPLANT
OCCLUDER COLPOPNEUMO (BALLOONS) ×7 IMPLANT
PACK ABDOMINAL GYN (CUSTOM PROCEDURE TRAY) ×1 IMPLANT
PACK ROBOT WH (CUSTOM PROCEDURE TRAY) ×4 IMPLANT
PACK ROBOTIC GOWN (GOWN DISPOSABLE) ×4 IMPLANT
PACK TRENDGUARD 450 HYBRID PRO (MISCELLANEOUS) ×1 IMPLANT
PAD OB MATERNITY 4.3X12.25 (PERSONAL CARE ITEMS) ×4 IMPLANT
PAD PREP 24X48 CUFFED NSTRL (MISCELLANEOUS) ×4 IMPLANT
POSITIONER SURGICAL ARM (MISCELLANEOUS) ×8 IMPLANT
PROTECTOR NERVE ULNAR (MISCELLANEOUS) ×8 IMPLANT
SEAL CANN UNIV 5-8 DVNC XI (MISCELLANEOUS) ×6 IMPLANT
SEAL XI 5MM-8MM UNIVERSAL (MISCELLANEOUS) ×6
SEALER VESSEL DA VINCI XI (MISCELLANEOUS) ×2
SEALER VESSEL EXT DVNC XI (MISCELLANEOUS) ×1 IMPLANT
SET CYSTO W/LG BORE CLAMP LF (SET/KITS/TRAYS/PACK) IMPLANT
SET TRI-LUMEN FLTR TB AIRSEAL (TUBING) ×4 IMPLANT
SPONGE LAP 18X18 X RAY DECT (DISPOSABLE) ×2 IMPLANT
STAPLER VISISTAT 35W (STAPLE) ×1 IMPLANT
STRIP CLOSURE SKIN 1/2X4 (GAUZE/BANDAGES/DRESSINGS) ×1 IMPLANT
SUT PLAIN 2 0 XLH (SUTURE) IMPLANT
SUT PROLENE 0 CT 1 30 (SUTURE) IMPLANT
SUT VIC AB 0 CT1 18XCR BRD8 (SUTURE) ×5 IMPLANT
SUT VIC AB 0 CT1 36 (SUTURE) ×16 IMPLANT
SUT VIC AB 0 CT1 8-18 (SUTURE) ×8
SUT VIC AB 2-0 CT1 27 (SUTURE)
SUT VIC AB 2-0 CT1 TAPERPNT 27 (SUTURE) ×1 IMPLANT
SUT VICRYL 0 TIES 12 18 (SUTURE) ×4 IMPLANT
SUT VICRYL 0 UR6 27IN ABS (SUTURE) ×7 IMPLANT
SUT VICRYL 4-0 PS2 18IN ABS (SUTURE) ×8 IMPLANT
SUT VLOC 180 0 9IN  GS21 (SUTURE) ×2
SUT VLOC 180 0 9IN GS21 (SUTURE) ×1 IMPLANT
SYR CONTROL 10ML LL (SYRINGE) ×4 IMPLANT
TIP RUMI ORANGE 6.7MMX12CM (TIP) ×3 IMPLANT
TIP UTERINE 5.1X6CM LAV DISP (MISCELLANEOUS) IMPLANT
TIP UTERINE 6.7X10CM GRN DISP (MISCELLANEOUS) IMPLANT
TIP UTERINE 6.7X6CM WHT DISP (MISCELLANEOUS) IMPLANT
TIP UTERINE 6.7X8CM BLUE DISP (MISCELLANEOUS) IMPLANT
TOWEL OR 17X24 6PK STRL BLUE (TOWEL DISPOSABLE) ×5 IMPLANT
TOWEL OR 17X26 10 PK STRL BLUE (TOWEL DISPOSABLE) ×5 IMPLANT
TRENDGUARD 450 HYBRID PRO PACK (MISCELLANEOUS) ×4
TROCAR PORT AIRSEAL 8X100 (TROCAR) ×3 IMPLANT
TROCAR PORT AIRSEAL 8X120 (TROCAR) ×4 IMPLANT
WATER STERILE IRR 1000ML POUR (IV SOLUTION) ×4 IMPLANT

## 2017-10-12 NOTE — Transfer of Care (Signed)
Immediate Anesthesia Transfer of Care Note  Patient: Audrey Bell  Procedure(s) Performed: XI ROBOTIC ASSISTED LAPAROSCOPIC HYSTERECTOMY AND SALPINGECTOMY (Bilateral Abdomen)  Patient Location: PACU  Anesthesia Type:General  Level of Consciousness: sedated and patient cooperative  Airway & Oxygen Therapy: Patient Spontanous Breathing and Patient connected to face mask oxygen  Post-op Assessment: Report given to RN and Post -op Vital signs reviewed and stable  Post vital signs: Reviewed and stable  Last Vitals:  Vitals Value Taken Time  BP    Temp    Pulse 72 10/12/2017  5:56 PM  Resp 9 10/12/2017  5:56 PM  SpO2 100 % 10/12/2017  5:56 PM  Vitals shown include unvalidated device data.  Last Pain: There were no vitals filed for this visit.    Patients Stated Pain Goal: 3 (28/00/34 9179)  Complications: No apparent anesthesia complications

## 2017-10-12 NOTE — Anesthesia Preprocedure Evaluation (Addendum)
Anesthesia Evaluation  Patient identified by MRN, date of birth, ID band Patient awake    Reviewed: Allergy & Precautions, NPO status , Patient's Chart, lab work & pertinent test results  Airway Mallampati: II  TM Distance: >3 FB Neck ROM: Full    Dental  (+) Dental Advisory Given, Teeth Intact   Pulmonary asthma ,    breath sounds clear to auscultation       Cardiovascular negative cardio ROS   Rhythm:Regular Rate:Normal     Neuro/Psych negative neurological ROS     GI/Hepatic negative GI ROS, Neg liver ROS,   Endo/Other  negative endocrine ROS  Renal/GU negative Renal ROS     Musculoskeletal  (+) Arthritis , Rheumatoid disorders,    Abdominal   Peds  Hematology  (+) anemia ,   Anesthesia Other Findings   Reproductive/Obstetrics                            Lab Results  Component Value Date   WBC 5.3 10/10/2017   HGB 11.3 (L) 10/10/2017   HCT 35.7 (L) 10/10/2017   MCV 88.1 10/10/2017   PLT 355 10/10/2017   Lab Results  Component Value Date   CREATININE 0.73 10/10/2017   BUN 12 10/10/2017   NA 139 10/10/2017   K 4.0 10/10/2017   CL 107 10/10/2017   CO2 27 10/10/2017    Anesthesia Physical Anesthesia Plan  ASA: II  Anesthesia Plan: General   Post-op Pain Management:    Induction: Intravenous  PONV Risk Score and Plan: 4 or greater and Scopolamine patch - Pre-op, Dexamethasone, Ondansetron, Treatment may vary due to age or medical condition and Midazolam  Airway Management Planned: Oral ETT  Additional Equipment:   Intra-op Plan:   Post-operative Plan: Extubation in OR  Informed Consent: I have reviewed the patients History and Physical, chart, labs and discussed the procedure including the risks, benefits and alternatives for the proposed anesthesia with the patient or authorized representative who has indicated his/her understanding and acceptance.   Dental  advisory given  Plan Discussed with: CRNA  Anesthesia Plan Comments:         Anesthesia Quick Evaluation

## 2017-10-12 NOTE — Anesthesia Postprocedure Evaluation (Signed)
Anesthesia Post Note  Patient: Audrey Bell  Procedure(s) Performed: XI ROBOTIC ASSISTED LAPAROSCOPIC HYSTERECTOMY AND SALPINGECTOMY (Bilateral Abdomen)     Patient location during evaluation: PACU Anesthesia Type: General Level of consciousness: awake and alert Pain management: pain level controlled Vital Signs Assessment: post-procedure vital signs reviewed and stable Respiratory status: spontaneous breathing, nonlabored ventilation, respiratory function stable and patient connected to nasal cannula oxygen Cardiovascular status: blood pressure returned to baseline and stable Postop Assessment: no apparent nausea or vomiting Anesthetic complications: no    Last Vitals:  Vitals:   10/12/17 1900 10/12/17 2000  BP: (!) 141/90 119/72  Pulse: 93 72  Resp: 20 16  Temp: 36.7 C (!) 36.3 C  SpO2: 100% 100%    Last Pain:  Vitals:   10/12/17 2000  TempSrc: Oral  PainSc:                  Catalina Gravel

## 2017-10-12 NOTE — OR Nursing (Signed)
WEIGHT OF SPECIMEN IS 381.4 GRAMS

## 2017-10-12 NOTE — Anesthesia Procedure Notes (Signed)
Procedure Name: Intubation Date/Time: 10/12/2017 1:59 PM Performed by: Wanita Chamberlain, CRNA Pre-anesthesia Checklist: Patient identified, Emergency Drugs available, Suction available, Patient being monitored and Timeout performed Patient Re-evaluated:Patient Re-evaluated prior to induction Oxygen Delivery Method: Circle system utilized Preoxygenation: Pre-oxygenation with 100% oxygen Induction Type: IV induction Ventilation: Mask ventilation without difficulty Grade View: Grade I Tube type: Oral Tube size: 7.0 mm Number of attempts: 1 Airway Equipment and Method: Stylet Placement Confirmation: ETT inserted through vocal cords under direct vision,  positive ETCO2,  CO2 detector and breath sounds checked- equal and bilateral Secured at: 22 cm Tube secured with: Tape Dental Injury: Teeth and Oropharynx as per pre-operative assessment

## 2017-10-12 NOTE — Brief Op Note (Signed)
10/12/2017  5:51 PM  PATIENT:  Audrey Bell  42 y.o. female  PRE-OPERATIVE DIAGNOSIS:  Symptomatic Uterine Fibroids  POST-OPERATIVE DIAGNOSIS:  Symptomatic Uterine Fibroids  PROCEDURE:  Procedure(s): XI ROBOTIC ASSISTED LAPAROSCOPIC HYSTERECTOMY AND SALPINGECTOMY (Bilateral)  SURGEON:  Surgeon(s) and Role:    * Servando Salina, MD - Primary    * Almquist, Earlyne Iba, MD - Assisting  PHYSICIAN ASSISTANT:   ASSISTANTS: Tiana Loft, M.D.   ANESTHESIA:   general  EBL:  150 mL   BLOOD ADMINISTERED:none  DRAINS: none   LOCAL MEDICATIONS USED:  MARCAINE     SPECIMEN:  Source of Specimen:  uterus with cervix, tubes  DISPOSITION OF SPECIMEN:  PATHOLOGY  COUNTS:  YES  TOURNIQUET:  * No tourniquets in log *  DICTATION: .Other Dictation: Dictation Number [id number]  PLAN OF CARE: Admit for overnight observation  PATIENT DISPOSITION:  PACU - hemodynamically stable.   Delay start of Pharmacological VTE agent (>24hrs) due to surgical blood loss or risk of bleeding: no

## 2017-10-13 ENCOUNTER — Encounter (HOSPITAL_COMMUNITY): Payer: Self-pay | Admitting: Obstetrics and Gynecology

## 2017-10-13 DIAGNOSIS — D259 Leiomyoma of uterus, unspecified: Secondary | ICD-10-CM | POA: Diagnosis not present

## 2017-10-13 LAB — CBC
HEMATOCRIT: 31.8 % — AB (ref 36.0–46.0)
Hemoglobin: 9.9 g/dL — ABNORMAL LOW (ref 12.0–15.0)
MCH: 27.4 pg (ref 26.0–34.0)
MCHC: 31.1 g/dL (ref 30.0–36.0)
MCV: 88.1 fL (ref 78.0–100.0)
Platelets: 319 10*3/uL (ref 150–400)
RBC: 3.61 MIL/uL — AB (ref 3.87–5.11)
RDW: 15.3 % (ref 11.5–15.5)
WBC: 8.4 10*3/uL (ref 4.0–10.5)

## 2017-10-13 LAB — BASIC METABOLIC PANEL
ANION GAP: 8 (ref 5–15)
BUN: 7 mg/dL (ref 6–20)
CO2: 26 mmol/L (ref 22–32)
Calcium: 9 mg/dL (ref 8.9–10.3)
Chloride: 104 mmol/L (ref 101–111)
Creatinine, Ser: 0.68 mg/dL (ref 0.44–1.00)
GFR calc Af Amer: >60 mL/min (ref >60)
GFR calc non Af Amer: >60 mL/min (ref >60)
GLUCOSE: 109 mg/dL — AB (ref 65–99)
POTASSIUM: 4 mmol/L (ref 3.5–5.1)
Sodium: 138 mmol/L (ref 135–145)

## 2017-10-13 MED ORDER — IBUPROFEN 800 MG PO TABS: 800.0000 mg | ORAL_TABLET | Freq: Three times a day (TID) | ORAL | 4 refills | Status: AC | PRN

## 2017-10-13 MED ORDER — OXYCODONE-ACETAMINOPHEN 5-325 MG PO TABS: 2.0000 | ORAL_TABLET | ORAL | 0 refills | Status: AC | PRN

## 2017-10-13 MED ORDER — OXYCODONE-ACETAMINOPHEN 5-325 MG PO TABS
ORAL_TABLET | ORAL | Status: AC
  Filled 2017-10-13: qty 2

## 2017-10-13 NOTE — Discharge Summary (Signed)
Physician Discharge Summary  Patient ID: PEYTAN ANDRINGA MRN: [id number] DOB/AGE: 43-10-1975 43 y.o.  Admit date: 10/12/2017 Discharge date: 10/13/2017  Admission Diagnoses: symptomatic uterine fibroids  Discharge Diagnoses: symptomatic uterine fibroids Active Problems:   Status post total hysterectomy   Discharged Condition: stable  Hospital Course: pt was admitted to [facility] where she underwent davinci robotic total hysterectomy, bilateral salpingectomy. Pt had uncomplicated postoperative course  Consults: None  Significant Diagnostic Studies: labs:  CBC    Component Value Date/Time   WBC 8.4 10/13/2017 0626   RBC 3.61 (L) 10/13/2017 0626   HGB 9.9 (L) 10/13/2017 0626   HCT 31.8 (L) 10/13/2017 0626   PLT 319 10/13/2017 0626   MCV 88.1 10/13/2017 0626   MCH 27.4 10/13/2017 0626   MCHC 31.1 10/13/2017 0626   RDW 15.3 10/13/2017 0626   BMET    Component Value Date/Time   NA 138 10/13/2017 0626   K 4.0 10/13/2017 0626   CL 104 10/13/2017 0626   CO2 26 10/13/2017 0626   GLUCOSE 109 (H) 10/13/2017 0626   BUN 7 10/13/2017 0626   CREATININE 0.68 10/13/2017 0626   CALCIUM 9.0 10/13/2017 0626   GFRNONAA >60 10/13/2017 0626   GFRAA >60 10/13/2017 0626      Treatments: surgery: davinci robotic total hysterectomy, bilateral salpingectomy  Discharge Exam: Blood pressure 115/77, pulse 76, temperature 98.3 F (36.8 C), resp. rate 18, height 5' 7.5" (1.715 m), weight 73.9 kg (163 lb), last menstrual period 09/29/2017, SpO2 100 %. General appearance: alert, cooperative and no distress Resp: clear to auscultation bilaterally Cardio: regular rate and rhythm, S1, S2 normal, no murmur, click, rub or gallop GI: soft, non-tender; bowel sounds normal; no masses,  no organomegaly and incision: c/d/i Pelvic: deferred Extremities: no edema, redness or tenderness in the calves or thighs  Disposition: Discharge disposition: 01-Home or Self Care       Discharge Instructions    Call  MD for:  persistant nausea and vomiting   Complete by:  As directed    Call MD for:  redness, tenderness, or signs of infection (pain, swelling, redness, odor or green/yellow discharge around incision site)   Complete by:  As directed    Call MD for:  severe uncontrolled pain   Complete by:  As directed    Call MD for:  temperature >100.4   Complete by:  As directed    Diet - low sodium heart healthy   Complete by:  As directed    May walk up steps   Complete by:  As directed      Allergies as of 10/13/2017      Reactions   Shellfish Allergy Itching      Medication List    TAKE these medications   COMBIVENT RESPIMAT 20-100 MCG/ACT Aers respimat Generic drug:  Ipratropium-Albuterol Inhale 1 puff into the lungs every 6 (six) hours as needed for wheezing.   fexofenadine 180 MG tablet Commonly known as:  ALLEGRA Take 180 mg by mouth daily as needed for allergies or rhinitis.   hydrOXYzine 10 MG tablet Commonly known as:  ATARAX/VISTARIL Take 10 mg by mouth 3 (three) times daily as needed for itching.   ibuprofen 800 MG tablet Commonly known as:  ADVIL,MOTRIN Take 1 tablet (800 mg total) by mouth every 8 (eight) hours as needed (mild pain).   methylPREDNISolone 4 MG tablet Commonly known as:  MEDROL Take 4 mg by mouth daily.   naproxen sodium 220 MG tablet Commonly known as:  ALEVE Take 440 mg by mouth daily as needed (pain).   oxyCODONE-acetaminophen 5-325 MG tablet Commonly known as:  PERCOCET/ROXICET Take 2 tablets by mouth every 4 (four) hours as needed for up to 7 days for severe pain ((when tolerating fluids)).   PREVIDENT 5000 ENAMEL PROTECT 1.1-5 % Pste Generic drug:  Sod Fluoride-Potassium Nitrate Place 1 application onto teeth 2 (two) times daily.   XELJANZ 5 MG Tabs Generic drug:  Tofacitinib Citrate Take 5 mg by mouth 2 (two) times daily.      Follow-up Information    Servando Salina, MD Follow up in 2 week(s).   Specialty:  Obstetrics and  Gynecology Contact information: [address] [place] [address] [phone]           Signed: Alanda Slim A Clebert Wenger 10/13/2017, 9:00 AM

## 2017-10-13 NOTE — Discharge Instructions (Signed)
Call if temperature greater than equal to 100.4, nothing per vagina for 4-6 weeks or severe nausea vomiting, increased incisional pain , drainage or redness in the incision site, no straining with bowel movements, showers no bath °

## 2017-10-13 NOTE — Progress Notes (Signed)
Subjective: Patient reports tolerating PO, + flatus and no problems voiding.    Objective: I have reviewed patient's vital signs.  vital signs, intake and output and labs. Vitals:   10/13/17 0221 10/13/17 0600  BP: 138/81 115/77  Pulse: 66 76  Resp: 16 18  Temp: 97.6 F (36.4 C) 98.3 F (36.8 C)  SpO2: 100% 100%   I/O last 3 completed shifts: In: 3563.3 [P.O.:480; I.V.:2383.3; IV Piggyback:700] Out: 2836 [Urine:1600; Blood:150] No intake/output data recorded.  Lab Results  Component Value Date   WBC 8.4 10/13/2017   HGB 9.9 (L) 10/13/2017   HCT 31.8 (L) 10/13/2017   MCV 88.1 10/13/2017   PLT 319 10/13/2017   Lab Results  Component Value Date   CREATININE 0.68 10/13/2017    EXAM General: alert, cooperative and no distress Resp: clear to auscultation bilaterally Cardio: regular rate and rhythm, S1, S2 normal, no murmur, click, rub or gallop GI: soft, non-tender; bowel sounds normal; no masses,  no organomegaly and incision: clean, dry and intact Extremities: no edema, redness or tenderness in the calves or thighs Vaginal Bleeding: minimal  Assessment: s/p Procedure(s): XI ROBOTIC ASSISTED LAPAROSCOPIC HYSTERECTOMY with bilateral SALPINGECTOMY: stable, progressing well and tolerating diet intraop findings reviewed Postoperative anemia Plan: Encourage ambulation Discontinue IV fluids Discharge home  F/u 2 weeks D/c instructions reviewed. Script for percocet given   LOS: 0 days    Marvene Staff, MD 10/13/2017 8:59 AM

## 2017-10-13 NOTE — Progress Notes (Signed)
Pt discharged to home at 0930

## 2017-10-13 NOTE — Op Note (Signed)
NAME: Audrey Bell, Audrey Bell MEDICAL RECORD [id number] ACCOUNT [id number] DATE OF BIRTH:07/29/1975 FACILITY: [facility] LOCATION: [facility] PHYSICIAN:Ardene Remley A. Cait Locust, MD  OPERATIVE REPORT  DATE OF PROCEDURE:  10/12/2017  PREOPERATIVE DIAGNOSIS:  Symptomatic uterine fibroids.  PROCEDURE PERFORMED:  Da Vinci  XI robotic total hysterectomy, bilateral salpingectomy.  POSTOPERATIVE DIAGNOSIS:  Symptomatic uterine fibroids.  ANESTHESIA:  General. SURGEON:  Servando Salina, MD  ASSISTANT:  Tiana Loft, MD  DESCRIPTION OF PROCEDURE:  Under adequate general anesthesia, the patient was placed in the dorsal lithotomy position.  She was positioned for robotic surgery.  Examination under anesthesia revealed about a 12-week size uterus.  No adnexal masses could be appreciated.  The patient was then sterilely prepped and draped in the usual fashion.  A 3-way Foley catheter was placed.  A weighted speculum was placed in the vagina.  A Sims retractor was placed anteriorly.  The cervix os was parous.  O Vicryl figure-of-eight suture was placed on the anterior and posterior lip of the cervix.  The uterus sounded to 13 cm.  A #12 uterine manipulator with a medium KOH ring was utilized, and the retractors were removed.  Attention was then turned to the abdomen.   Supraumbilical 3.73% Marcaine was injected.  A small vertical incision was made.  Veress needle was introduced.  Opening pressure of 8 was noted.  Then 2.5 liters of CO2 was insufflated.  Veress needle was then removed.  An 8 mm robotic camera port site  was then inserted.  The camera was then inserted, confirming entry into the abdomen without incident.  Panoramic inspection.  Normal liver edge.  The patient was placed in Trendelenburg position.  The other robotic port sites, 2 on the right and 1 on the  left, with intervening 8 mm AirSeal, were all placed.  The robot was docked at the patient's bedside, and in arm #1 was the vessel sealer, in arm  #3 was the long bipolar forceps, and in arm #4 was the monopolar scissors.  I then went to the surgical console and the pelvis was inspected.  Large fibroid uterus was noted.  Normal tubes and ovaries were noted bilaterally.  No other lesions were noted.  The procedure was started on the left with cauterization of the mesosalpinx using the vessel sealant  and removal of the left tube.  The left retroperitoneal space was opened.  A window was placed  through the broad ligament, and the left uteroovarian ligaments were serially clamped, cauterized, and then cut.  The round ligament was then clamped, cauterized, and cut.  The vesicouterine peritoneum was then opened.  The uterine vessels were skeletonized.  Attention was then turned to the bladder area.  The vesicouterine peritoneum was opened transversely, and the bladder was then sharply dissected  off the KOH ring.  Attention was then turned back to the left uterine vessels, which were cauterized but not cut.  The same procedure was performed on the contralateral side with the remaining a vesicouterine peritoneum being opened.  The right uterine  vessels were skeletonized.  Bleeding was encountered at that site, and cauterization using the vessel sealer and the bipolar forceps resulted in hemostasis ultimately.  Once the bladder was down completely, the ureter had been identified on the right as well.  The insufflation in the vagina was done. The left uterine vessels were then clamped cauterized and cut. The anterior cervicovaginal junction was opened, and subsequently circumferentially cauterization used with removal of the uterus from its vaginal attachment.  Once this  was done, the specimen was able to be brought out through the vagina and weighed about 381 g.  The instruments were then replaced using the Mega Suture Needle Driver, with the monopolar scissors removed.  Using 0 V-Loc suture, the vaginal cuff was closed.  The bladder was displaced further  inferiorly.  Small bleeding along the adnexal edges were cauterized carefully.  The abdomen was irrigated and suctioned.  Once the cuff was closed, digital palpation was confirmed good closure.  The robotic instruments were then removed.  The  robot was undocked.  I went back to the patient's bedside.  The pelvis was inspected.  Good hemostasis was noted.  The Arista potato starch was then placed over the vaginal cuff area, and the robotic ports were then removed.  The AirSeal was removed last.  The incisions were then closed with 4-0 Vicryl subcuticular closure and Dermabond.  SPECIMEN:  The uterus with cervix and fallopian tubes sent to pathology.  ESTIMATED BLOOD LOSS:  150 mL.  INTRAOPERATIVE FLUIDS:  600 mL crystalloid and 2 units of albumin.  URINE OUTPUT:  300 mL.  Sponge and instrument counts x2 was correct.  Complications were none.  The patient tolerated the procedure well and was transferred to recovery room in stable condition.  LN/NUANCE  D:10/12/2017 T:10/13/2017 JOB:[id number]
